# Patient Record
Sex: Female | Born: 1950 | Race: Black or African American | Hispanic: No | Smoking: Former smoker
Health system: Southern US, Community
[De-identification: ages and names within clinical notes are randomized; demographics above are authoritative.]

## PROBLEM LIST (undated history)

## (undated) DIAGNOSIS — M47816 Spondylosis without myelopathy or radiculopathy, lumbar region: Secondary | ICD-10-CM

## (undated) DIAGNOSIS — I10 Essential (primary) hypertension: Secondary | ICD-10-CM

## (undated) DIAGNOSIS — E785 Hyperlipidemia, unspecified: Secondary | ICD-10-CM

## (undated) DIAGNOSIS — I708 Atherosclerosis of other arteries: Secondary | ICD-10-CM

## (undated) DIAGNOSIS — E669 Obesity, unspecified: Secondary | ICD-10-CM

## (undated) DIAGNOSIS — M62838 Other muscle spasm: Secondary | ICD-10-CM

## (undated) DIAGNOSIS — M419 Scoliosis, unspecified: Secondary | ICD-10-CM

## (undated) DIAGNOSIS — I700 Atherosclerosis of aorta: Secondary | ICD-10-CM

## (undated) HISTORY — DX: Atherosclerosis of aorta: I70.0

## (undated) HISTORY — DX: Scoliosis, unspecified: M41.9

## (undated) HISTORY — DX: Other muscle spasm: M62.838

## (undated) HISTORY — DX: Essential (primary) hypertension: I10

## (undated) HISTORY — DX: Spondylosis without myelopathy or radiculopathy, lumbar region: M47.816

## (undated) HISTORY — DX: Obesity, unspecified: E66.9

## (undated) HISTORY — DX: Atherosclerosis of other arteries: I70.8

## (undated) HISTORY — DX: Hyperlipidemia, unspecified: E78.5

---

## 1998-06-17 HISTORY — PX: ABDOMINAL HYSTERECTOMY: SHX81

## 2013-06-07 ENCOUNTER — Ambulatory Visit: Payer: Self-pay | Admitting: Gastroenterology

## 2013-06-14 ENCOUNTER — Ambulatory Visit: Payer: Self-pay | Admitting: Family Medicine

## 2013-06-29 ENCOUNTER — Ambulatory Visit: Payer: Self-pay | Admitting: Family Medicine

## 2014-12-05 ENCOUNTER — Other Ambulatory Visit: Payer: Self-pay

## 2014-12-05 DIAGNOSIS — I10 Essential (primary) hypertension: Secondary | ICD-10-CM

## 2014-12-05 MED ORDER — LISINOPRIL 40 MG PO TABS: 40.0000 mg | ORAL_TABLET | Freq: Every day | ORAL | Status: DC

## 2014-12-05 MED ORDER — HYDROCHLOROTHIAZIDE 25 MG PO TABS: 25.0000 mg | ORAL_TABLET | Freq: Every day | ORAL | Status: DC

## 2014-12-05 NOTE — Telephone Encounter (Signed)
Received a request for refill for her HCTZ 25 mg & Lisinopril 40mg  to be sent to [facility]

## 2015-05-02 ENCOUNTER — Other Ambulatory Visit: Payer: Self-pay

## 2015-05-02 DIAGNOSIS — I10 Essential (primary) hypertension: Secondary | ICD-10-CM

## 2015-05-02 MED ORDER — LISINOPRIL 40 MG PO TABS: 40.0000 mg | ORAL_TABLET | Freq: Every day | ORAL | Status: DC

## 2015-05-02 MED ORDER — HYDROCHLOROTHIAZIDE 25 MG PO TABS: 25.0000 mg | ORAL_TABLET | Freq: Every day | ORAL | Status: DC

## 2015-05-02 NOTE — Telephone Encounter (Signed)
Got a fax from [place] requesting a refill of this patient's Prinivil 40mg  & HCTZ 25mg .  Refill request was sent to Dr. Bobetta Lime for approval and submission.  Lisinopril 40MG , 1 (one) Tablet daily, #30, 30 days starting 05/31/2014, Ref. x5. Active. Associated Diagnosis:Hypertension, goal below 150/90 (401.9  I10) Recorded 05/31/2014 04:39 PM by Bobetta Lime, MD, Office Visit.  Hydrochlorothiazide 25MG , 1 (one) Tablet daily, #30, 30 days starting 05/31/2014, Ref. x5. Active. Associated Diagnosis:Hypertension, goal below 150/90 (401.9  I10) Recorded 05/31/2014 04:39 PM by Bobetta Lime, MD, Office Visit.

## 2015-05-05 ENCOUNTER — Telehealth: Payer: Self-pay

## 2015-05-05 NOTE — Telephone Encounter (Signed)
Got a callfrom the pharmacy tech, Levada Dy, requesting a refill of this patient's HCTZ and Lisnopril. Dr. Nadine Counts spoke with her and authorized the refill with a 90 days supply.

## 2015-11-14 ENCOUNTER — Ambulatory Visit (INDEPENDENT_AMBULATORY_CARE_PROVIDER_SITE_OTHER): Payer: Managed Care, Other (non HMO) | Admitting: Family Medicine

## 2015-11-14 ENCOUNTER — Encounter: Payer: Self-pay | Admitting: Family Medicine

## 2015-11-14 VITALS — BP 122/82 | HR 106 | Temp 98.3°F | Resp 20 | Ht 63.0 in | Wt 186.3 lb

## 2015-11-14 DIAGNOSIS — M40294 Other kyphosis, thoracic region: Secondary | ICD-10-CM

## 2015-11-14 DIAGNOSIS — Z1239 Encounter for other screening for malignant neoplasm of breast: Secondary | ICD-10-CM | POA: Diagnosis not present

## 2015-11-14 DIAGNOSIS — Z23 Encounter for immunization: Secondary | ICD-10-CM | POA: Diagnosis not present

## 2015-11-14 DIAGNOSIS — Z1159 Encounter for screening for other viral diseases: Secondary | ICD-10-CM | POA: Diagnosis not present

## 2015-11-14 DIAGNOSIS — Z114 Encounter for screening for human immunodeficiency virus [HIV]: Secondary | ICD-10-CM | POA: Diagnosis not present

## 2015-11-14 DIAGNOSIS — Z0000 Encounter for general adult medical examination without abnormal findings: Secondary | ICD-10-CM

## 2015-11-14 MED ORDER — CYCLOBENZAPRINE HCL 5 MG PO TABS: 5.0000 mg | ORAL_TABLET | Freq: Three times a day (TID) | ORAL | Status: DC | PRN

## 2015-11-14 NOTE — Patient Instructions (Addendum)
Do get vitamin D3 1,000 iu daily Return for fasting labs later this week Start a baby 81 mg coated aspirin once a day for stroke prevention Please do call to schedule your bone density study; the number to schedule one at either [facility] or [facility] is [phone]  Health Maintenance, Female Adopting a healthy lifestyle and getting preventive care can go a long way to promote health and wellness. Talk with your health care provider about what schedule of regular examinations is right for you. This is a good chance for you to check in with your provider about disease prevention and staying healthy. In between checkups, there are plenty of things you can do on your own. Experts have done a lot of research about which lifestyle changes and preventive measures are most likely to keep you healthy. Ask your health care provider for more information. WEIGHT AND DIET  Eat a healthy diet  Be sure to include plenty of vegetables, fruits, low-fat dairy products, and lean protein.  Do not eat a lot of foods high in solid fats, added sugars, or salt.  Get regular exercise. This is one of the most important things you can do for your health.  Most adults should exercise for at least 150 minutes each week. The exercise should increase your heart rate and make you sweat (moderate-intensity exercise).  Most adults should also do strengthening exercises at least twice a week. This is in addition to the moderate-intensity exercise.  Maintain a healthy weight  Body mass index (BMI) is a measurement that can be used to identify possible weight problems. It estimates body fat based on height and weight. Your health care provider can help determine your BMI and help you achieve or maintain a healthy weight.  For females 76 years of age and older:   A BMI below 18.5 is considered underweight.  A BMI of 18.5 to 24.9 is normal.  A BMI of 25 to 29.9 is considered  overweight.  A BMI of 30 and above is considered obese.  Watch levels of cholesterol and blood lipids  You should start having your blood tested for lipids and cholesterol at 63 years of age, then have this test every 5 years.  You may need to have your cholesterol levels checked more often if:  Your lipid or cholesterol levels are high.  You are older than 63 years of age.  You are at high risk for heart disease.  CANCER SCREENING   Lung Cancer  Lung cancer screening is recommended for adults 3-70 years old who are at high risk for lung cancer because of a history of smoking.  A yearly low-dose CT scan of the lungs is recommended for people who:  Currently smoke.  Have quit within the past 15 years.  Have at least a 30-pack-year history of smoking. A pack year is smoking an average of one pack of cigarettes a day for 1 year.  Yearly screening should continue until it has been 15 years since you quit.  Yearly screening should stop if you develop a health problem that would prevent you from having lung cancer treatment.  Breast Cancer  Practice breast self-awareness. This means understanding how your breasts normally appear and feel.  It also means doing regular breast self-exams. Let your health care provider know about any changes, no matter how small.  If you are in your 20s or 30s, you should have a clinical breast exam (CBE) by a health care  provider every 1-3 years as part of a regular health exam.  If you are 34 or older, have a CBE every year. Also consider having a breast X-ray (mammogram) every year.  If you have a family history of breast cancer, talk to your health care provider about genetic screening.  If you are at high risk for breast cancer, talk to your health care provider about having an MRI and a mammogram every year.  Breast cancer gene (BRCA) assessment is recommended for women who have family members with BRCA-related cancers. BRCA-related  cancers include:  Breast.  Ovarian.  Tubal.  Peritoneal cancers.  Results of the assessment will determine the need for genetic counseling and BRCA1 and BRCA2 testing. Cervical Cancer Your health care provider may recommend that you be screened regularly for cancer of the pelvic organs (ovaries, uterus, and vagina). This screening involves a pelvic examination, including checking for microscopic changes to the surface of your cervix (Pap test). You may be encouraged to have this screening done every 3 years, beginning at age 27.  For women ages 60-65, health care providers may recommend pelvic exams and Pap testing every 3 years, or they may recommend the Pap and pelvic exam, combined with testing for human papilloma virus (HPV), every 5 years. Some types of HPV increase your risk of cervical cancer. Testing for HPV may also be done on women of any age with unclear Pap test results.  Other health care providers may not recommend any screening for nonpregnant women who are considered low risk for pelvic cancer and who do not have symptoms. Ask your health care provider if a screening pelvic exam is right for you.  If you have had past treatment for cervical cancer or a condition that could lead to cancer, you need Pap tests and screening for cancer for at least 20 years after your treatment. If Pap tests have been discontinued, your risk factors (such as having a new sexual partner) need to be reassessed to determine if screening should resume. Some women have medical problems that increase the chance of getting cervical cancer. In these cases, your health care provider may recommend more frequent screening and Pap tests. Colorectal Cancer  This type of cancer can be detected and often prevented.  Routine colorectal cancer screening usually begins at 63 years of age and continues through 63 years of age.  Your health care provider may recommend screening at an earlier age if you have risk  factors for colon cancer.  Your health care provider may also recommend using home test kits to check for hidden blood in the stool.  A small camera at the end of a tube can be used to examine your colon directly (sigmoidoscopy or colonoscopy). This is done to check for the earliest forms of colorectal cancer.  Routine screening usually begins at age 44.  Direct examination of the colon should be repeated every 5-10 years through 63 years of age. However, you may need to be screened more often if early forms of precancerous polyps or small growths are found. Skin Cancer  Check your skin from head to toe regularly.  Tell your health care provider about any new moles or changes in moles, especially if there is a change in a mole's shape or color.  Also tell your health care provider if you have a mole that is larger than the size of a pencil eraser.  Always use sunscreen. Apply sunscreen liberally and repeatedly throughout the day.  Protect yourself  by wearing long sleeves, pants, a wide-brimmed hat, and sunglasses whenever you are outside. HEART DISEASE, DIABETES, AND HIGH BLOOD PRESSURE   High blood pressure causes heart disease and increases the risk of stroke. High blood pressure is more likely to develop in:  People who have blood pressure in the high end of the normal range (130-139/85-89 mm Hg).  People who are overweight or obese.  People who are African American.  If you are 51-52 years of age, have your blood pressure checked every 3-5 years. If you are 73 years of age or older, have your blood pressure checked every year. You should have your blood pressure measured twice--once when you are at a hospital or clinic, and once when you are not at a hospital or clinic. Record the average of the two measurements. To check your blood pressure when you are not at a hospital or clinic, you can use:  An automated blood pressure machine at a pharmacy.  A home blood pressure  monitor.  If you are between 80 years and 10 years old, ask your health care provider if you should take aspirin to prevent strokes.  Have regular diabetes screenings. This involves taking a blood sample to check your fasting blood sugar level.  If you are at a normal weight and have a low risk for diabetes, have this test once every three years after 63 years of age.  If you are overweight and have a high risk for diabetes, consider being tested at a younger age or more often. PREVENTING INFECTION  Hepatitis B  If you have a higher risk for hepatitis B, you should be screened for this virus. You are considered at high risk for hepatitis B if:  You were born in a country where hepatitis B is common. Ask your health care provider which countries are considered high risk.  Your parents were born in a high-risk country, and you have not been immunized against hepatitis B (hepatitis B vaccine).  You have HIV or AIDS.  You use needles to inject street drugs.  You live with someone who has hepatitis B.  You have had sex with someone who has hepatitis B.  You get hemodialysis treatment.  You take certain medicines for conditions, including cancer, organ transplantation, and autoimmune conditions. Hepatitis C  Blood testing is recommended for:  Everyone born from [birth year] through 1963.  Anyone with known risk factors for hepatitis C. Sexually transmitted infections (STIs)  You should be screened for sexually transmitted infections (STIs) including gonorrhea and chlamydia if:  You are sexually active and are younger than 63 years of age.  You are older than 63 years of age and your health care provider tells you that you are at risk for this type of infection.  Your sexual activity has changed since you were last screened and you are at an increased risk for chlamydia or gonorrhea. Ask your health care provider if you are at risk.  If you do not have HIV, but are at risk, it may be  recommended that you take a prescription medicine daily to prevent HIV infection. This is called pre-exposure prophylaxis (PrEP). You are considered at risk if:  You are sexually active and do not regularly use condoms or know the HIV status of your partner(s).  You take drugs by injection.  You are sexually active with a partner who has HIV. Talk with your health care provider about whether you are at high risk of being infected with HIV.  If you choose to begin PrEP, you should first be tested for HIV. You should then be tested every 3 months for as long as you are taking PrEP.  PREGNANCY   If you are premenopausal and you may become pregnant, ask your health care provider about preconception counseling.  If you may become pregnant, take 400 to 800 micrograms (mcg) of folic acid every day.  If you want to prevent pregnancy, talk to your health care provider about birth control (contraception). OSTEOPOROSIS AND MENOPAUSE   Osteoporosis is a disease in which the bones lose minerals and strength with aging. This can result in serious bone fractures. Your risk for osteoporosis can be identified using a bone density scan.  If you are 27 years of age or older, or if you are at risk for osteoporosis and fractures, ask your health care provider if you should be screened.  Ask your health care provider whether you should take a calcium or vitamin D supplement to lower your risk for osteoporosis.  Menopause may have certain physical symptoms and risks.  Hormone replacement therapy may reduce some of these symptoms and risks. Talk to your health care provider about whether hormone replacement therapy is right for you.  HOME CARE INSTRUCTIONS   Schedule regular health, dental, and eye exams.  Stay current with your immunizations.   Do not use any tobacco products including cigarettes, chewing tobacco, or electronic cigarettes.  If you are pregnant, do not drink alcohol.  If you are  breastfeeding, limit how much and how often you drink alcohol.  Limit alcohol intake to no more than 1 drink per day for nonpregnant women. One drink equals 12 ounces of beer, 5 ounces of wine, or 1 ounces of hard liquor.  Do not use street drugs.  Do not share needles.  Ask your health care provider for help if you need support or information about quitting drugs.  Tell your health care provider if you often feel depressed.  Tell your health care provider if you have ever been abused or do not feel safe at home.   This information is not intended to replace advice given to you by your health care provider. Make sure you discuss any questions you have with your health care provider.   Document Released: 12/17/2010 Document Revised: 06/24/2014 Document Reviewed: 05/05/2013 Elsevier Interactive Patient Education [employer].

## 2015-11-14 NOTE — Progress Notes (Signed)
Patient ID: Judith Olson, female   DOB: 12/23/1950, 65 y.o.   MRN: [id number]   Subjective:   Judith Olson is a 65 y.o. female here for a complete physical exam  Interim issues since last visit: muscle spasms on left side of thoracic area; went to ER, was given meds  USPSTF grade A and B recommendations Alcohol: twice a year Depression:  Depression screen [facility] 2/9 11/14/2015  Decreased Interest 0  Down, Depressed, Hopeless 0  PHQ - 2 Score 0   Hypertension: controlled Obesity: working on weight loss Tobacco use: former smoker; quit in 1991 HIV, hep C: okay STD testing and prevention (chl/gon/syphilis): urine for GC/CHL Lipids: return on Friday Glucose: return on Friday Colorectal cancer: 2 years ago at [facility] Breast cancer: done 2 years Intimate partner violence:no Cervical cancer screening: s/p hyst Lung cancer: quit in 1991 Osteoporosis: usually start at age 44, but she has kyphosis Fall prevention/vitamin D: no regular supplements AAA: n/a Aspirin: baby aspirin 81 mg daily Diet: good diet Exercise: getting exercise Skin cancer: nothing worrisome, dark spots on the face; mole on left breast stable  Past Medical History  Diagnosis Date  . Hypertension   . Muscle spasms of both lower extremities     Obesity; 225 pounds peak weight  Past Surgical History  Procedure Laterality Date  . Abdominal hysterectomy  2000    MD notes: Hysterectomy; for fibroids; no cancer, BSO 2000  Family History  Problem Relation Age of Onset  . Cancer Father     porstate  . Cancer Sister     breast    Mother: deceased, diabetes; died from aneurysm and broken heart after her husband died; 38 or 79 years old Father: deceased, prostate cancer about 20 or 31 years old Sister: died of breast cancer, stopped treatment, died at age 44 Brother: heart attack, from stress after mother died  Social History  Substance Use Topics  . Smoking status: Former Research scientist (life sciences)  . Smokeless  tobacco: Not on file  . Alcohol Use: 0.0 oz/week    0 Standard drinks or equivalent per week   Review of Systems  Constitutional: Negative for unexpected weight change.  HENT: Negative for hearing loss.   Eyes: Negative for visual disturbance.  Respiratory: Negative for shortness of breath.   Cardiovascular: Negative for chest pain and leg swelling.  Gastrointestinal: Negative for blood in stool.  Genitourinary: Negative for hematuria.  Musculoskeletal: Positive for back pain.  Hematological: Does not bruise/bleed easily.   Objective:   Filed Vitals:   11/14/15 1423  BP: 122/82  Pulse: 106  Temp: 98.3 F (36.8 C)  Resp: 20  Height: 5\' 3"  (1.6 m)  Weight: 186 lb 5 oz (84.511 kg)  SpO2: 98%   Body mass index is 33.01 kg/(m^2). Wt Readings from Last 3 Encounters:  11/14/15 186 lb 5 oz (84.511 kg)   Physical Exam  Constitutional: She appears well-developed and well-nourished.  HENT:  Head: Normocephalic and atraumatic.  Right Ear: Hearing, tympanic membrane, external ear and ear canal normal.  Left Ear: Hearing, tympanic membrane, external ear and ear canal normal.  Eyes: Conjunctivae and EOM are normal. Right eye exhibits no hordeolum. Left eye exhibits no hordeolum. No scleral icterus.  Neck: Carotid bruit is not present. No thyromegaly present.  Cardiovascular: Normal rate, regular rhythm, S1 normal, S2 normal and normal heart sounds.   No extrasystoles are present.  Pulmonary/Chest: Effort normal and breath sounds normal. No respiratory distress. Right breast exhibits no inverted nipple,  no mass, no nipple discharge, no skin change and no tenderness. Left breast exhibits no inverted nipple, no mass, no nipple discharge, no skin change and no tenderness. Breasts are symmetrical.  Abdominal: Soft. Normal appearance and bowel sounds are normal. She exhibits no distension, no abdominal bruit, no pulsatile midline mass and no mass. There is no hepatosplenomegaly. There is no  tenderness. No hernia.  Musculoskeletal: Normal range of motion. She exhibits no edema.       Thoracic back: She exhibits deformity (thoracic kyphosis).  Lymphadenopathy:       Head (right side): No submandibular adenopathy present.       Head (left side): No submandibular adenopathy present.    She has no cervical adenopathy.    She has no axillary adenopathy.  Neurological: She is alert. She displays no tremor. No cranial nerve deficit. She exhibits normal muscle tone. Gait normal.  Reflex Scores:      Patellar reflexes are 2+ on the right side and 2+ on the left side. Skin: Skin is warm and dry. No bruising and no ecchymosis noted. No cyanosis. No pallor.  Psychiatric: Her speech is normal and behavior is normal. Thought content normal. Her mood appears not anxious. She does not exhibit a depressed mood.    Assessment/Plan:   Problem List Items Addressed This Visit      Musculoskeletal and Integument   Other kyphosis of thoracic region    DEXA scan      Relevant Orders   DG Bone Density     Other   Breast cancer screening    SBE encouraged once a month; CBE today; mammo ordered      Relevant Orders   MM DIGITAL SCREENING BILATERAL   Need for hepatitis C screening test    Discussed one-time HIV screening recommendation per USPSTF guidelines; patient agrees with testing; HIV antibody ordered      Relevant Orders   Hepatitis C antibody (Completed)   Need for shingles vaccine   Relevant Orders   Varicella-zoster vaccine subcutaneous (Completed)   Preventative health care - Primary    USPSTF grade A and B recommendations reviewed with patient; age-appropriate recommendations, preventive care, screening tests, etc discussed and encouraged; healthy living encouraged; see AVS for patient education given to patient      Relevant Orders   CBC with Differential/Platelet (Completed)   Comprehensive metabolic panel (Completed)   Lipid Panel w/o Chol/HDL Ratio (Completed)    TSH (Completed)   Screening for HIV (human immunodeficiency virus)    Discussed one-time HIV screening recommendation per USPSTF guidelines; patient agrees with testing; HIV antibody ordered      Relevant Orders   HIV antibody (Completed)      Meds ordered this encounter  Medications  . cyclobenzaprine (FLEXERIL) 5 MG tablet    Sig: Take 1 tablet (5 mg total) by mouth every 8 (eight) hours as needed for muscle spasms.    Dispense:  21 tablet    Refill:  1   Orders Placed This Encounter  Procedures  . DG Bone Density    Order Specific Question:  Reason for Exam (SYMPTOM  OR DIAGNOSIS REQUIRED)    Answer:  screening    Order Specific Question:  Preferred imaging location?    Answer:  [facility]  . MM DIGITAL SCREENING BILATERAL    Order Specific Question:  Reason for Exam (SYMPTOM  OR DIAGNOSIS REQUIRED)    Answer:  screening    Order Specific Question:  Preferred imaging location?  Answer:  [facility]  . Varicella-zoster vaccine subcutaneous  . CBC with Differential/Platelet  . Comprehensive metabolic panel    Order Specific Question:  Has the patient fasted?    Answer:  Yes  . Lipid Panel w/o Chol/HDL Ratio    Order Specific Question:  Has the patient fasted?    Answer:  Yes  . HIV antibody  . Hepatitis C antibody  . TSH   Follow up plan: Return in about 1 year (around 11/13/2016) for complete physical.  An After Visit Summary was printed and given to the patient.

## 2015-11-17 ENCOUNTER — Other Ambulatory Visit: Payer: Self-pay

## 2015-11-21 LAB — COMPREHENSIVE METABOLIC PANEL
A/G RATIO: 1.5 (ref 1.2–2.2)
ALBUMIN: 4.3 g/dL (ref 3.6–4.8)
ALT: 10 IU/L (ref 0–32)
AST: 14 IU/L (ref 0–40)
Alkaline Phosphatase: 90 IU/L (ref 39–117)
BILIRUBIN TOTAL: 0.5 mg/dL (ref 0.0–1.2)
BUN / CREAT RATIO: 13 (ref 12–28)
BUN: 12 mg/dL (ref 8–27)
CHLORIDE: 99 mmol/L (ref 96–106)
CO2: 25 mmol/L (ref 18–29)
Calcium: 10.2 mg/dL (ref 8.7–10.3)
Creatinine, Ser: 0.92 mg/dL (ref 0.57–1.00)
GFR, EST AFRICAN AMERICAN: 77 mL/min/{1.73_m2} (ref >59)
GFR, EST NON AFRICAN AMERICAN: 67 mL/min/{1.73_m2} (ref >59)
Globulin, Total: 2.8 g/dL (ref 1.5–4.5)
Glucose: 95 mg/dL (ref 65–99)
POTASSIUM: 3.9 mmol/L (ref 3.5–5.2)
Sodium: 140 mmol/L (ref 134–144)
TOTAL PROTEIN: 7.1 g/dL (ref 6.0–8.5)

## 2015-11-21 LAB — CBC WITH DIFFERENTIAL/PLATELET
BASOS: 0 %
Basophils Absolute: 0 10*3/uL (ref 0.0–0.2)
EOS (ABSOLUTE): 0.1 10*3/uL (ref 0.0–0.4)
EOS: 1 %
HEMATOCRIT: 38.1 % (ref 34.0–46.6)
HEMOGLOBIN: 12.4 g/dL (ref 11.1–15.9)
IMMATURE GRANS (ABS): 0 10*3/uL (ref 0.0–0.1)
Immature Granulocytes: 0 %
LYMPHS: 47 %
Lymphocytes Absolute: 2.4 10*3/uL (ref 0.7–3.1)
MCH: 27.2 pg (ref 26.6–33.0)
MCHC: 32.5 g/dL (ref 31.5–35.7)
MCV: 84 fL (ref 79–97)
MONOCYTES: 8 %
Monocytes Absolute: 0.4 10*3/uL (ref 0.1–0.9)
NEUTROS ABS: 2.3 10*3/uL (ref 1.4–7.0)
Neutrophils: 44 %
Platelets: 222 10*3/uL (ref 150–379)
RBC: 4.56 x10E6/uL (ref 3.77–5.28)
RDW: 15.3 % (ref 12.3–15.4)
WBC: 5.1 10*3/uL (ref 3.4–10.8)

## 2015-11-21 LAB — HEPATITIS C ANTIBODY

## 2015-11-21 LAB — LIPID PANEL W/O CHOL/HDL RATIO
Cholesterol, Total: 175 mg/dL (ref 100–199)
HDL: 48 mg/dL (ref >39)
LDL Calculated: 109 mg/dL — ABNORMAL HIGH (ref 0–99)
Triglycerides: 89 mg/dL (ref 0–149)
VLDL CHOLESTEROL CAL: 18 mg/dL (ref 5–40)

## 2015-11-21 LAB — TSH: TSH: 0.767 u[IU]/mL (ref 0.450–4.500)

## 2015-11-21 LAB — HIV ANTIBODY (ROUTINE TESTING W REFLEX): HIV SCREEN 4TH GENERATION: NONREACTIVE

## 2015-12-10 ENCOUNTER — Encounter: Payer: Self-pay | Admitting: Family Medicine

## 2015-12-10 DIAGNOSIS — Z1239 Encounter for other screening for malignant neoplasm of breast: Secondary | ICD-10-CM | POA: Insufficient documentation

## 2015-12-10 DIAGNOSIS — M40294 Other kyphosis, thoracic region: Secondary | ICD-10-CM | POA: Insufficient documentation

## 2015-12-10 DIAGNOSIS — Z1159 Encounter for screening for other viral diseases: Secondary | ICD-10-CM | POA: Insufficient documentation

## 2015-12-10 DIAGNOSIS — Z0000 Encounter for general adult medical examination without abnormal findings: Secondary | ICD-10-CM | POA: Insufficient documentation

## 2015-12-10 DIAGNOSIS — Z23 Encounter for immunization: Secondary | ICD-10-CM | POA: Insufficient documentation

## 2015-12-10 DIAGNOSIS — Z114 Encounter for screening for human immunodeficiency virus [HIV]: Secondary | ICD-10-CM | POA: Insufficient documentation

## 2015-12-10 NOTE — Assessment & Plan Note (Signed)
USPSTF grade A and B recommendations reviewed with patient; age-appropriate recommendations, preventive care, screening tests, etc discussed and encouraged; healthy living encouraged; see AVS for patient education given to patient  

## 2015-12-10 NOTE — Assessment & Plan Note (Signed)
Discussed one-time HIV screening recommendation per USPSTF guidelines; patient agrees with testing; HIV antibody ordered 

## 2015-12-10 NOTE — Assessment & Plan Note (Signed)
SBE encouraged once a month; CBE today; mammo ordered

## 2015-12-10 NOTE — Assessment & Plan Note (Signed)
DEXA scan

## 2016-05-15 ENCOUNTER — Emergency Department: Payer: Managed Care, Other (non HMO)

## 2016-05-15 ENCOUNTER — Encounter: Payer: Self-pay | Admitting: *Deleted

## 2016-05-15 ENCOUNTER — Emergency Department
Admission: EM | Admit: 2016-05-15 | Discharge: 2016-05-15 | Disposition: A | Discharge status: Home / Self Care | Payer: Managed Care, Other (non HMO) | Attending: Emergency Medicine | Admitting: Emergency Medicine

## 2016-05-15 DIAGNOSIS — I10 Essential (primary) hypertension: Secondary | ICD-10-CM | POA: Diagnosis not present

## 2016-05-15 DIAGNOSIS — M1991 Primary osteoarthritis, unspecified site: Secondary | ICD-10-CM | POA: Diagnosis not present

## 2016-05-15 DIAGNOSIS — M419 Scoliosis, unspecified: Secondary | ICD-10-CM | POA: Diagnosis not present

## 2016-05-15 DIAGNOSIS — Z87891 Personal history of nicotine dependence: Secondary | ICD-10-CM | POA: Insufficient documentation

## 2016-05-15 DIAGNOSIS — M545 Low back pain: Secondary | ICD-10-CM | POA: Diagnosis present

## 2016-05-15 MED ORDER — MELOXICAM 15 MG PO TABS: 15.0000 mg | ORAL_TABLET | Freq: Every day | ORAL | 0 refills | Status: DC

## 2016-05-15 NOTE — ED Triage Notes (Signed)
States mid back pain for several months, denies any known injury, pt ambulatory

## 2016-05-15 NOTE — ED Provider Notes (Signed)
[facility] Emergency Department Provider Note ____________________________________________  Time seen: Approximately 3:24 PM  I have reviewed the triage vital signs and the nursing notes.   HISTORY  Chief Complaint Back Pain    HPI Judith Olson is a 65 y.o. female that presents with 6 months of low right back pain. Patient describes pain as squeezing. Pain does not radiate. Pain does not wake patient at night.  No urinary incontinence. No difficulty walking. Patient denies tingling, weakness, or numbness. Patient has not seen anyone for this before. Patient has been taking Advil for pain which does not help.   Past Medical History:  Diagnosis Date  . Hypertension   . Muscle spasms of both lower extremities     Patient Active Problem List   Diagnosis Date Noted  . Other kyphosis of thoracic region 12/10/2015  . Preventative health care 12/10/2015  . Breast cancer screening 12/10/2015  . Need for shingles vaccine 12/10/2015  . Screening for HIV (human immunodeficiency virus) 12/10/2015  . Need for hepatitis C screening test 12/10/2015    Past Surgical History:  Procedure Laterality Date  . ABDOMINAL HYSTERECTOMY  2000    Prior to Admission medications   Medication Sig Start Date End Date Taking? Authorizing Provider  cyclobenzaprine (FLEXERIL) 5 MG tablet Take 1 tablet (5 mg total) by mouth every 8 (eight) hours as needed for muscle spasms. 11/14/15   Arnetha Courser, MD  hydrochlorothiazide (HYDRODIURIL) 25 MG tablet Take 1 tablet (25 mg total) by mouth daily. 05/02/15   Bobetta Lime, MD  lisinopril (PRINIVIL,ZESTRIL) 40 MG tablet Take 1 tablet (40 mg total) by mouth daily. 05/02/15   Bobetta Lime, MD  meloxicam (MOBIC) 15 MG tablet Take 1 tablet (15 mg total) by mouth daily. 05/15/16 05/15/17  Laban Emperor, PA-C    Allergies Patient has no known allergies.  Family History  Problem Relation Age of Onset  . Cancer Father     porstate   . Cancer Sister     breast    Social History Social History  Substance Use Topics  . Smoking status: Former Research scientist (life sciences)  . Smokeless tobacco: Not on file  . Alcohol use 0.0 oz/week    Review of Systems Constitutional: No recent illness. Cardiovascular: Denies chest pain or palpitations. Respiratory: Denies shortness of breath. Musculoskeletal: Pain in low right back. Skin: Negative for rash, wound, lesion. Neurological: Negative for focal weakness or numbness.  ____________________________________________   PHYSICAL EXAM:  VITAL SIGNS: ED Triage Vitals [05/15/16 1437]  Enc Vitals Group     BP (!) 162/96     Pulse Rate 63     Resp 18     Temp 97.9 F (36.6 C)     Temp Source Oral     SpO2 100 %     Weight 186 lb (84.4 kg)     Height 5\' 2"  (1.575 m)     Head Circumference      Peak Flow      Pain Score 6     Pain Loc      Pain Edu?      Excl. in [place]?     Constitutional: Alert and oriented. Well appearing and in no acute distress. Eyes: Conjunctivae are normal. EOMI. Head: Atraumatic. Neck: No stridor.  Cardiovascular: Regular rate and rhythm. Respiratory: Normal respiratory effort.   Abdomen: Normoactive bowel sounds. No tenderness to palpation. No abdominal bruits.  Musculoskeletal: Tender to palpation over right SI joint. No tenderness over vertebrae. Full ROM  of back. Full ROM of upper and lower extremities. Negative faber test, straight leg raise, cross leg raise. No CVA tenderness.  Neurologic:  Normal speech and language. No gross focal neurologic deficits are appreciated. Speech is normal. No gait instability. Strength 5/5 in lower extremities. No sensation changes. Skin:  Skin is warm, dry and intact. Atraumatic. Psychiatric: Mood and affect are normal. Speech and behavior are normal. __________________________________________   LABS (all labs ordered are listed, but only abnormal results are displayed)  Labs Reviewed - No data to  display ____________________________________________  RADIOLOGY I, Laban Emperor, personally viewed and evaluated these images (plain radiographs) as part of my medical decision making, as well as reviewing the written report by the radiologist.  Xray findings per radiology.  1. Lumbar spine scoliosis concave left with diffuse degenerative  change. No acute bony abnormality.    2. 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  Patient presents with back pain for 6 months. Xray was ordered since pain has been present for 6 months. Xray showed scoliosis, which patient was aware of, and degenerative changes. Patient was referred to orthopedics. Patient was advised to make an appointment if back pain persists despite anti-inflammatory use. No evidence of cauda hematoma, vascular catastrophe, spinal abscess/hematoma, or referred intra-abdominal pain. All patients questions questions were answered. All Xray findings were disclosed to patient. Patient was advised to follow up with PCP for atherosclerotic vascular disease seen on xray.  ____________________________________________   FINAL CLINICAL IMPRESSION(S) / ED DIAGNOSES  Final diagnoses:  Primary osteoarthritis, unspecified site  Scoliosis of lumbar spine, unspecified scoliosis type      Laban Emperor, PA-C 05/15/16 1627    Carrie Mew, MD 05/15/16 1939

## 2016-06-06 ENCOUNTER — Encounter: Payer: Self-pay | Admitting: Family Medicine

## 2016-06-06 ENCOUNTER — Ambulatory Visit (INDEPENDENT_AMBULATORY_CARE_PROVIDER_SITE_OTHER): Payer: Managed Care, Other (non HMO) | Admitting: Family Medicine

## 2016-06-06 VITALS — BP 130/80 | HR 67 | Temp 98.4°F | Resp 14 | Wt 169.0 lb

## 2016-06-06 DIAGNOSIS — E669 Obesity, unspecified: Secondary | ICD-10-CM

## 2016-06-06 DIAGNOSIS — I708 Atherosclerosis of other arteries: Secondary | ICD-10-CM

## 2016-06-06 DIAGNOSIS — I700 Atherosclerosis of aorta: Secondary | ICD-10-CM | POA: Diagnosis not present

## 2016-06-06 DIAGNOSIS — M419 Scoliosis, unspecified: Secondary | ICD-10-CM

## 2016-06-06 DIAGNOSIS — Z5181 Encounter for therapeutic drug level monitoring: Secondary | ICD-10-CM | POA: Diagnosis not present

## 2016-06-06 DIAGNOSIS — M47816 Spondylosis without myelopathy or radiculopathy, lumbar region: Secondary | ICD-10-CM

## 2016-06-06 DIAGNOSIS — E6609 Other obesity due to excess calories: Secondary | ICD-10-CM

## 2016-06-06 DIAGNOSIS — Z6830 Body mass index (BMI) 30.0-30.9, adult: Secondary | ICD-10-CM

## 2016-06-06 DIAGNOSIS — Z6837 Body mass index (BMI) 37.0-37.9, adult: Secondary | ICD-10-CM | POA: Insufficient documentation

## 2016-06-06 DIAGNOSIS — I70299 Other atherosclerosis of native arteries of extremities, unspecified extremity: Secondary | ICD-10-CM | POA: Diagnosis not present

## 2016-06-06 HISTORY — DX: Spondylosis without myelopathy or radiculopathy, lumbar region: M47.816

## 2016-06-06 HISTORY — DX: Atherosclerosis of other arteries: I70.8

## 2016-06-06 HISTORY — DX: Atherosclerosis of aorta: I70.0

## 2016-06-06 HISTORY — DX: Scoliosis, unspecified: M41.9

## 2016-06-06 HISTORY — DX: Obesity, unspecified: E66.9

## 2016-06-06 MED ORDER — ATORVASTATIN CALCIUM 20 MG PO TABS: 20.0000 mg | ORAL_TABLET | Freq: Every day | ORAL | 1 refills | Status: DC

## 2016-06-06 MED ORDER — MELOXICAM 15 MG PO TABS: 15.0000 mg | ORAL_TABLET | Freq: Every day | ORAL | 5 refills | Status: DC

## 2016-06-06 NOTE — Assessment & Plan Note (Signed)
Refer to physical therapy 

## 2016-06-06 NOTE — Assessment & Plan Note (Signed)
Patient is working hard on weight loss; if pannus becomes infected, develops candidiasis, take pictures, notify me; encouragement and praise given for her weight loss efforts

## 2016-06-06 NOTE — Patient Instructions (Signed)
Use the meloxicam as directed if needed Avoid other NSAIDs like Advil and Motrin Okay to use tylenol per package directions if needed Try to limit saturated fats in your diet (bologna, hot dogs, barbeque, cheeseburgers, hamburgers, steak, bacon, sausage, cheese, etc.) and get more fresh fruits, vegetables, and whole grains Start the cholesterol medicine Return in 6 weeks for fasting labs only

## 2016-06-06 NOTE — Assessment & Plan Note (Signed)
Check sgpt in 6 weeks; monitor creatninine on NSAID

## 2016-06-06 NOTE — Assessment & Plan Note (Signed)
rx for meloxicam approved, since this helps; avoid other NSAIDs; okay to take tylenol per package directions if something extra needed; refer to physical therapy

## 2016-06-06 NOTE — Assessment & Plan Note (Addendum)
Discussed findings with her of the atherosclerosis in her arteries; new LDL goal of less than 70; limit saturated fats; recommended statin; continue aspirin

## 2016-06-06 NOTE — Progress Notes (Signed)
BP 130/80   Pulse 67   Temp 98.4 F (36.9 C) (Oral)   Resp 14   Wt 169 lb (76.7 kg)   SpO2 97%   BMI 30.91 kg/m    Subjective:    Patient ID: Judith Olson, female    DOB: [birth year], 64 y.o.   MRN: [id number]  HPI: Judith Olson is a 65 y.o. female  Chief Complaint  Patient presents with  . Follow-up ER  . Back Pain    She saw the ER doctor for back pain on November 29th; note reviewed with her; she agrees with history; chronic low back pain, no urinary incontinence, no leg weakness; she was told she has arthritis; they prescribed meloxicam; she says the meloxicam really helps; taking about 3 days a week; she eats before she takes it and does fine She would like a refill  BP was up at the ER; patient says she was in pain that day ------------------------------------ Lumbar spine films May 15, 2016 EXAM: LUMBAR SPINE - 2-3 VIEW  COMPARISON:  No recent prior.  FINDINGS: Scoliosis lumbar spine. Diffuse multilevel degenerative change. No acute abnormality identified. Sclerotic density noted over the right iliac wing, most likely bone island. Sclerotic densities noted over the abdomen most likely vascular. Aortoiliac atherosclerotic vascular disease noted.  IMPRESSION: 1. Lumbar spine scoliosis concave left with diffuse degenerative change. No acute bony abnormality.  2. 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   Electronically Signed   By: Marcello Moores  Register   On: 05/15/2016 15:45 ------------------------------------------- We discussed the finding of aortoiliac and visceral atherosclerotic vascular disease Hx of smoking; quit a while ago; never a heavy smoker Last LDL 109; she has never taken a statin; she takes an aspirin daily  She has been working hard on weight loss; she wants to get flap of skin on abdomen removed later; watching what she eats; trying to lose weight; goal is 145 pounds; eating [place] and fish and chicken,  avoiding hamburgers; very occasional hot dog; boiling and baking food; she gets sweating under the skin of the fold of the abdomen; no redness now but she will watch; weight in the ER was NOT measured she says, she has lost the weight gradually over the last several months and did not lose all this weight over the last 3 weeks  Depression screen [facility] 2/9 06/06/2016 11/14/2015  Decreased Interest 0 0  Down, Depressed, Hopeless 0 0  PHQ - 2 Score 0 0   Relevant past medical, surgical, family and social history reviewed Past Medical History:  Diagnosis Date  . Hypertension   . Muscle spasms of both lower extremities   . Scoliosis of lumbar spine 06/06/2016   Past Surgical History:  Procedure Laterality Date  . ABDOMINAL HYSTERECTOMY  2000   Family History  Problem Relation Age of Onset  . Cancer Father     porstate  . Cancer Sister     breast   Social History  Substance Use Topics  . Smoking status: Former Research scientist (life sciences)  . Smokeless tobacco: Not on file  . Alcohol use 0.0 oz/week   Interim medical history since last visit reviewed. Allergies and medications reviewed  Review of Systems Per HPI unless specifically indicated above     Objective:    BP 130/80   Pulse 67   Temp 98.4 F (36.9 C) (Oral)   Resp 14   Wt 169 lb (76.7 kg)   SpO2 97%   BMI 30.91 kg/m   Wt  Readings from Last 3 Encounters:  06/06/16 169 lb (76.7 kg)  05/15/16 186 lb (84.4 kg)  11/14/15 186 lb 5 oz (84.5 kg)   MD note: patient was not weighed in the ER  Physical Exam  Constitutional: She appears well-developed and well-nourished. No distress.  Weight loss noted  Cardiovascular: Normal rate.   Pulmonary/Chest: Effort normal.  Musculoskeletal:       Lumbar back: She exhibits normal range of motion.  Normal ROM with flexion and extension at the waist  Neurological: She is alert. She displays no tremor. Gait normal.  LE strength 5/5  Skin: No pallor.  Psychiatric: She has a normal mood and affect.  Her behavior is normal.   Results for orders placed or performed in visit on 11/14/15  CBC with Differential/Platelet  Result Value Ref Range   WBC 5.1 3.4 - 10.8 x10E3/uL   RBC 4.56 3.77 - 5.28 x10E6/uL   Hemoglobin 12.4 11.1 - 15.9 g/dL   Hematocrit 38.1 34.0 - 46.6 %   MCV 84 79 - 97 fL   MCH 27.2 26.6 - 33.0 pg   MCHC 32.5 31.5 - 35.7 g/dL   RDW 15.3 12.3 - 15.4 %   Platelets 222 150 - 379 x10E3/uL   Neutrophils 44 %   Lymphs 47 %   Monocytes 8 %   Eos 1 %   Basos 0 %   Neutrophils Absolute 2.3 1.4 - 7.0 x10E3/uL   Lymphocytes Absolute 2.4 0.7 - 3.1 x10E3/uL   Monocytes Absolute 0.4 0.1 - 0.9 x10E3/uL   EOS (ABSOLUTE) 0.1 0.0 - 0.4 x10E3/uL   Basophils Absolute 0.0 0.0 - 0.2 x10E3/uL   Immature Granulocytes 0 %   Immature Grans (Abs) 0.0 0.0 - 0.1 x10E3/uL  Comprehensive metabolic panel  Result Value Ref Range   Glucose 95 65 - 99 mg/dL   BUN 12 8 - 27 mg/dL   Creatinine, Ser 0.92 0.57 - 1.00 mg/dL   GFR calc non Af Amer 67 >59 mL/min/1.73   GFR calc Af Amer 77 >59 mL/min/1.73   BUN/Creatinine Ratio 13 12 - 28   Sodium 140 134 - 144 mmol/L   Potassium 3.9 3.5 - 5.2 mmol/L   Chloride 99 96 - 106 mmol/L   CO2 25 18 - 29 mmol/L   Calcium 10.2 8.7 - 10.3 mg/dL   Total Protein 7.1 6.0 - 8.5 g/dL   Albumin 4.3 3.6 - 4.8 g/dL   Globulin, Total 2.8 1.5 - 4.5 g/dL   Albumin/Globulin Ratio 1.5 1.2 - 2.2   Bilirubin Total 0.5 0.0 - 1.2 mg/dL   Alkaline Phosphatase 90 39 - 117 IU/L   AST 14 0 - 40 IU/L   ALT 10 0 - 32 IU/L  Lipid Panel w/o Chol/HDL Ratio  Result Value Ref Range   Cholesterol, Total 175 100 - 199 mg/dL   Triglycerides 89 0 - 149 mg/dL   HDL 48 >39 mg/dL   VLDL Cholesterol Cal 18 5 - 40 mg/dL   LDL Calculated 109 (H) 0 - 99 mg/dL  HIV antibody  Result Value Ref Range   HIV Screen 4th Generation wRfx Non Reactive Non Reactive  Hepatitis C antibody  Result Value Ref Range   Hep C Virus Ab <0.1 0.0 - 0.9 s/co ratio  TSH  Result Value Ref Range   TSH  0.767 0.450 - 4.500 uIU/mL      Assessment & Plan:   Problem List Items Addressed This Visit      Cardiovascular  and Mediastinum   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)    Discussed findings with her of the atherosclerosis in her arteries; new LDL goal of less than 70; limit saturated fats; recommended statin; continue aspirin      Relevant Medications   atorvastatin (LIPITOR) 20 MG tablet   Other Relevant Orders   Lipid panel     Musculoskeletal and Integument   Scoliosis of lumbar spine (Chronic)    Refer to physical therapy      Relevant Orders   Ambulatory referral to Physical Therapy   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)    rx for meloxicam approved, since this helps; avoid other NSAIDs; okay to take tylenol per package directions if something extra needed; refer to physical therapy      Relevant Medications   meloxicam (MOBIC) 15 MG tablet   Other Relevant Orders   Ambulatory referral to Physical Therapy     Other   Obesity    Patient is working hard on weight loss; if pannus becomes infected, develops candidiasis, take pictures, notify me; encouragement and praise given for her weight loss efforts      Medication monitoring encounter    Check sgpt in 6 weeks; monitor creatninine on NSAID      Relevant Orders   COMPLETE METABOLIC PANEL WITH GFR       Follow up plan: No Follow-up on file.  An after-visit summary was printed and given to the patient at [place].  Please see the patient instructions which may contain other information and recommendations beyond what is mentioned above in the assessment and plan.  Meds ordered this encounter  Medications  . meloxicam (MOBIC) 15 MG tablet    Sig: Take 1 tablet (15 mg total) by mouth daily. Take after eating / with food    Dispense:  30 tablet    Refill:  5  . atorvastatin (LIPITOR) 20 MG tablet    Sig: Take 1 tablet (20 mg total) by mouth at bedtime.    Dispense:  30 tablet     Refill:  1    Orders Placed This Encounter  Procedures  . Lipid panel  . COMPLETE METABOLIC PANEL WITH GFR  . Ambulatory referral to Physical Therapy

## 2016-07-02 ENCOUNTER — Ambulatory Visit: Payer: Commercial Managed Care - PPO

## 2016-07-16 ENCOUNTER — Ambulatory Visit: Payer: Commercial Managed Care - PPO

## 2016-07-16 ENCOUNTER — Ambulatory Visit: Payer: Commercial Managed Care - PPO | Attending: Family Medicine

## 2016-07-16 VITALS — BP 169/92 | HR 62

## 2016-07-16 DIAGNOSIS — M545 Low back pain, unspecified: Secondary | ICD-10-CM

## 2016-07-16 NOTE — Patient Instructions (Addendum)
Adduction: Hip - Knees Together (Sitting)  Quadriceps Set (Prone)   With toes supporting lower legs, squeeze rear end muscles and tighten thigh muscles to straighten knees. Hold _5___ seconds. Relax. Repeat __10__ times per set. Do ___3_ sets per session. Do ___1_ sessions per day.  http://orth.exer.us/726   Copyright  VHI. All rights reserved.        Transversus abdominis contraction in sitting or standing.    Pretend that there is a string attached from one side of your pelvis to the other.    Tighten that "string."   Hold for 5 seconds.   Perform throughout the day.     Also add tightening your pelvic floor muscles with the exercise.

## 2016-07-16 NOTE — Therapy (Signed)
[facility] [address] [place], [address] Phone: [phone]   Fax:  [phone]  Physical Therapy Treatment  Patient Details  Name: Judith Olson MRN: [id number] Date of Birth: Jul 08, 1950 Referring Provider: Enid Derry, MD  Encounter Date: 07/16/2016      PT End of Session - 07/16/16 1515    Visit Number 1   Number of Visits 9   Date for PT Re-Evaluation 08/15/16   Authorization Type --   PT Start Time 1515   PT Stop Time 1619   PT Time Calculation (min) 64 min   Activity Tolerance Patient tolerated treatment well   Behavior During Therapy [facility] for tasks assessed/performed      Past Medical History:  Diagnosis Date  . Hypertension   . Muscle spasms of both lower extremities   . Scoliosis of lumbar spine 06/06/2016    Past Surgical History:  Procedure Laterality Date  . ABDOMINAL HYSTERECTOMY  2000    Vitals:   07/16/16 1527  BP: (!) 169/92  Pulse: 62        Subjective Assessment - 07/16/16 1524    Subjective Back pain: 4/10 currently (pt sitting, took pain medication today), 8/10 at worst.    Pertinent History Back pain. Sudden onset. Pt states that her back has been bothering her for a while. Pain comes and goes. Pain started in her L side. Had pain patches which did not help. 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 The pain medication that her current doctor gave her helps. No leg pain. Denies bowel or bladder problems or LE paresthesia.  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  Pt also does crunches, leg lifts, tricep dips. Tries to exercise.  Pt states that her back pain is getting better since taking the new pain medication.    Patient Stated Goals Pt expresses desire to get better.   Currently in Pain? Yes   Pain Score 4    Pain Location Back   Pain Orientation Lower    Pain Descriptors / Indicators --  squeezing, then release   Pain Type Chronic pain   Pain Onset More than a month ago   Pain Frequency Occasional   Aggravating Factors  bending forward, picking up parts for a transmission at work [employer]),    Pain Relieving Factors Bending her hips and knees to pick up stuff (ergonomic lifting), sitting, standing up straight.             [facility] PT Assessment - 07/16/16 1518      Assessment   Medical Diagnosis Spondylosis of lumbar region without myelopathy or radiculopathy; scoliosis of lumbar spine   Referring Provider Enid Derry, MD   Onset Date/Surgical Date 06/06/16  Date PT referral signed. Chronic condition. No specific date   Prior Therapy No known PT for current condition     Precautions   Precaution Comments aortoiliac artherosclerosis per radiograph report     Restrictions   Other Position/Activity Restrictions no known weight bearing restrictions     Balance Screen   Has the patient fallen in the past 6 months No   Has the patient had a decrease in activity level because of a fear of falling?  No   Is the patient reluctant to leave their home because of a fear of falling?  No  Home Environment   Additional Comments Patient lives in an apartment alone. no stairs     Prior Function   Vocation Full time employment;Part time employment  lifts car parts (transmission parts) for [employer]   Vocation Requirements PLOF: no back pain with lifting at least 10 lbs (car part average weight)     Observation/Other Assessments   Observations pt states taking her pain medication today. Good ergonomics with picking up 10 lbs from the floor.  (-) SLUMP bilaterally.    Modified [place] 2%     Posture/Postural Control   Posture Comments bilaterally protracted shoulders and neck, slight R lateral lean, bilateral foot pronation     AROM   Overall AROM Comments no pain with overpressure. Pt states taking pain medication   Lumbar Flexion full, no  pain   Lumbar Extension full, no pain   Lumbar - Right Side Bend WFL   Lumbar - Left Side Bend WFL   Lumbar - Right Rotation full   Lumbar - Left Rotation full      Strength   Right Hip Flexion 4/5   Right Hip Extension 4-/5   Right Hip ABduction 4/5   Left Hip Flexion 4/5   Left Hip Extension 4/5   Left Hip ABduction 4/5   Right Knee Flexion 4+/5   Right Knee Extension 5/5   Left Knee Flexion 4/5   Left Knee Extension 5/5     Ambulation/Gait   Gait Comments R lateral lean during R LE stance phase.  Increased anterior pelvic rotation during ipsilateral swing phase       Objectives  There-ex  Directed patient with prone glute max/quad set 10x2 with 5 second holds each LE  No back pain during exercises.   Supine transversus abdominis contraction 10x5 seconds  Then in sitting 10x5 seconds   Then in sitting with gentle pelvic floor contractions 10x5 seconds   Reviewed HEP. Pt demonstrated and verbalized understanding.   Improved exercise technique, movement at target joints, use of target muscles after mod verbal, visual, tactile cues.                                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    Baseline 8/10 at worst   Time 4   Period Weeks   Status New     PT LONG TERM GOAL #2   Title Patient will improve bilateral hip extension and abduction strength by at least 1/2 MMT grade to promote ability to perform standing tasks.    Time 4   Period Weeks   Status New               Plan - 07/16/16 1619    Clinical Impression Statement Patient is a 66 year old female who came to physical therapy secondary to back pain. She also presents with altered gait pattern and posture, hip weakness, decreased lumbopelvic control. Patient  will benefit from skilled physical therapy intervention to address the aforementioned deficits and to continue with decreasing back pain.    Rehab Potential Good   Clinical Impairments Affecting Rehab Potential (-) scoliosis; (+) motivation, decreased pain with medication   PT Frequency 2x /  week  1-2x/week    PT Duration 4 weeks  or less   PT Treatment/Interventions Therapeutic exercise;Therapeutic activities;Manual techniques;Dry needling;Patient/family education;Neuromuscular re-education;Ultrasound;Traction;Moist Heat;Iontophoresis 4mg /ml Dexamethasone;Electrical Stimulation;Aquatic Therapy   PT Next Visit Plan core strengthening, thoracic extension, scapular strengthening, lumbopelvic control, hip strengthening   Consulted and Agree with Plan of Care Patient      Patient will benefit from skilled therapeutic intervention in order to improve the following deficits and impairments:  Pain, Improper body mechanics, Decreased strength  Visit Diagnosis: Bilateral low back pain without sciatica, unspecified chronicity - Plan: PT plan of care cert/re-cert     Problem List Patient Active Problem List   Diagnosis Date Noted  . Aorto-iliac atherosclerosis ([place]) 06/06/2016  . 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 Scoliosis of lumbar spine 06/06/2016  . Medication monitoring encounter 06/06/2016  . Obesity 06/06/2016  . Other kyphosis of thoracic region 12/10/2015  . Preventative health care 12/10/2015  . Breast cancer screening 12/10/2015  . Need for shingles vaccine 12/10/2015  . Screening for HIV (human immunodeficiency virus) 12/10/2015  . Need for hepatitis C screening test 12/10/2015    [name] PT, DPT   07/16/2016, 5:15 PM  [facility] [address] [place], [address] Phone: [phone]   Fax:  [phone]  Name: Judith Olson MRN: [id number] Date of Birth:  03-31-1951

## 2016-07-18 ENCOUNTER — Ambulatory Visit: Payer: Commercial Managed Care - PPO | Attending: Family Medicine

## 2016-07-18 DIAGNOSIS — M545 Low back pain, unspecified: Secondary | ICD-10-CM

## 2016-07-18 NOTE — Patient Instructions (Signed)
Caudal Rotation: Hip Roll, Neutral Lordosis - Supine   Lie with knees bent, feet flat. Lower knees out to right side, rotating hips and trunk. Return. Repeat for other side. Repeat __10__ times per set. Do _3___ sets per session.    Copyright  VHI. All rights reserved.    

## 2016-07-18 NOTE — Therapy (Signed)
[place] [facility] [address], [place], [address] Phone: [phone]   Fax:  [phone]  Physical Therapy Treatment  Patient Details  Name: Judith Olson MRN: [id number] Date of Birth: 11-11-1950 Referring Provider: Enid Derry, MD  Encounter Date: 07/18/2016      PT End of Session - 07/18/16 1612    Visit Number 2   Number of Visits 9   Date for PT Re-Evaluation 08/15/16   PT Start Time [id number]   PT Stop Time 1655   PT Time Calculation (min) 41 min   Activity Tolerance Patient tolerated treatment well   Behavior During Therapy [facility] for tasks assessed/performed      Past Medical History:  Diagnosis Date  . Hypertension   . Muscle spasms of both lower extremities   . Scoliosis of lumbar spine 06/06/2016    Past Surgical History:  Procedure Laterality Date  . ABDOMINAL HYSTERECTOMY  2000    There were no vitals filed for this visit.      Subjective Assessment - 07/18/16 1614    Subjective Back is fine. Has a little pain because she did not take her pain medication today. Pain is on the L side which is the side where it originally started.  The reason it is hurting because the person who came before her at work left a mess that she had to clean up. The feeling of being upset/tension increased her back pain. 4.5/10 back pain currently.    Pertinent History Back pain. Sudden onset. Pt states that her back has been bothering her for a while. Pain comes and goes. Pain started in her L side. Had pain patches which did not help. 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 The pain medication that her current doctor gave her helps. No leg pain. Denies bowel or bladder problems or LE paresthesia.  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  Pt also does crunches, leg lifts, tricep dips. Tries to exercise.  Pt states  that her back pain is getting better since taking the new pain medication.    Patient Stated Goals Pt expresses desire to get better.   Currently in Pain? Yes   Pain Score 5   4.5/10   Pain Location Back   Pain Orientation Left;Posterior   Pain Descriptors / Indicators Sharp   Pain Onset More than a month ago            [facility] PT Assessment - 07/18/16 1624      AROM   Overall AROM Comments decresaed back pain afterwards, especially with overpressure to R side bending   Lumbar Flexion full, no pain   Lumbar Extension full, no pain   Lumbar - Right Side Bend WFL   Lumbar - Left Side Bend WFL   Lumbar - Right Rotation full   Lumbar - Left Rotation full                              PT Education - 07/18/16 1628    Education provided Yes   Education Details ther-ex, HEP   Person(s) Educated Patient   Methods Explanation;Demonstration;Tactile cues;Verbal cues;Handout   Comprehension Returned demonstration;Verbalized understanding        Objectives  There-ex  Directed patient with standing trunk flexion, extension, side  bend, rotation 1x each way, then with overpressure. No pain.   R trunk side bend 10x2 Supine lower trunk rotation 10x3  Decreased back pain to 1/10  Reviewed and given as part of her HEP. Pt was recommended to not over exert herself with her exercises. Pt verbalized understanding.    prone glute max/quad set 10x2 with 5 second holds each LE  Decreased back pain to 0.5/10           Improved exercise technique, movement at target joints, use of target muscles after min to mod verbal, visual, tactile cues.     Vitals obtained: 176/99, HR 74, L arm sitting, mechanically taken   172/110, L arm sitting, manually taken.   Rest provided  178/112 L arm sitting, manually taken after rest.   Session stopped secondary to elevated levels.    No lightheadedness or dizziness throughout session, asymptomatic.   Pt states that her MD  told her that she did not have to take her blood pressure medication because she lost a lot of weight. Pt was recommended to talk to her MD about her levels. Pt states she will see her MD on Monday about it.   Pt was also recommended to call her MD about her blood pressure today or tomorrow to see what she wants to do. Pt verbalized understanding.      Improved exercise technique, movement at target joints, use of target muscles after min to mod verbal, visual, tactile cues.     Back pain decreased to 0.5 to 1/10 after session with rotational movements and activation of glute max muscles. Pt demonstrates elevated blood pressure levels, asymptomatic. Pt was recommended to contact her MD about her levels. Pt verbalized understanding.        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    Baseline 8/10 at worst   Time 4   Period Weeks   Status New     PT LONG TERM GOAL #2   Title Patient will improve bilateral hip extension and abduction strength by at least 1/2 MMT grade to promote ability to perform standing tasks.    Time 4   Period Weeks   Status New               Plan - 07/18/16 1630    Clinical Impression Statement Back pain decreased to 0.5 to 1/10 after session with rotational movements and activation of glute max muscles. Pt demonstrates elevated blood pressure levels, asymptomatic. Pt was recommended to contact her MD about her levels. Pt verbalized understanding.    Rehab Potential Good   Clinical Impairments Affecting Rehab Potential (-) scoliosis; (+) motivation, decreased pain with medication   PT Frequency 2x / week  1-2x/week    PT Duration 4 weeks  or less   PT Treatment/Interventions Therapeutic exercise;Therapeutic activities;Manual techniques;Dry  needling;Patient/family education;Neuromuscular re-education;Ultrasound;Traction;Moist Heat;Iontophoresis 4mg /ml Dexamethasone;Electrical Stimulation;Aquatic Therapy   PT Next Visit Plan core strengthening, thoracic extension, scapular strengthening, lumbopelvic control, hip strengthening   Consulted and Agree with Plan of Care Patient      Patient will benefit from skilled therapeutic intervention in order to improve the following deficits and impairments:  Pain, Improper body mechanics, Decreased strength  Visit Diagnosis: Bilateral low back pain without  sciatica, unspecified chronicity     Problem List Patient Active Problem List   Diagnosis Date Noted  . Aorto-iliac atherosclerosis ([place]) 06/06/2016  . 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 Scoliosis of lumbar spine 06/06/2016  . Medication monitoring encounter 06/06/2016  . Obesity 06/06/2016  . Other kyphosis of thoracic region 12/10/2015  . Preventative health care 12/10/2015  . Breast cancer screening 12/10/2015  . Need for shingles vaccine 12/10/2015  . Screening for HIV (human immunodeficiency virus) 12/10/2015  . Need for hepatitis C screening test 12/10/2015    [name] PT, DPT   07/18/2016, 5:22 PM  [place] [facility] [address], [place], [address] Phone: [phone]   Fax:  [phone]  Name: Judith Olson MRN: [id number] Date of Birth: [birth year]

## 2016-07-22 ENCOUNTER — Telehealth: Payer: Self-pay

## 2016-07-25 ENCOUNTER — Ambulatory Visit: Payer: Commercial Managed Care - PPO

## 2016-07-25 DIAGNOSIS — M545 Low back pain, unspecified: Secondary | ICD-10-CM

## 2016-07-25 NOTE — Therapy (Signed)
[place] [facility] [address], [place], [address] Phone: [phone]   Fax:  [phone]  Physical Therapy Treatment  Patient Details  Name: Judith Olson MRN: [id number] Date of Birth: 09-23-1950 Referring Provider: Enid Derry, MD  Encounter Date: 07/25/2016      PT End of Session - 07/25/16 1558    Visit Number 3   Number of Visits 9   Date for PT Re-Evaluation 08/15/16   PT Start Time 1559  pt arrived late   PT Stop Time 1629   PT Time Calculation (min) 30 min   Activity Tolerance Patient tolerated treatment well   Behavior During Therapy [facility] for tasks assessed/performed      Past Medical History:  Diagnosis Date  . Hypertension   . Muscle spasms of both lower extremities   . Scoliosis of lumbar spine 06/06/2016    Past Surgical History:  Procedure Laterality Date  . ABDOMINAL HYSTERECTOMY  2000    There were no vitals filed for this visit.      Subjective Assessment - 07/25/16 1601    Subjective Pt states that she gave a call to her doctor about her blood pressure and was told to stop by the office when she is able to.  Back is doing good. Has been doing her exercises. Not overexerting herself.  Took pain medication this morning at 5 am. Usually takes it every 3 days.    Pertinent History Back pain. Sudden onset. Pt states that her back has been bothering her for a while. Pain comes and goes. Pain started in her L side. Had pain patches which did not help. 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 The pain medication that her current doctor gave her helps. No leg pain. Denies bowel or bladder problems or LE paresthesia.  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  Pt also does crunches, leg lifts, tricep dips. Tries to exercise.  Pt states that her back pain is getting better since taking the new  pain medication.    Patient Stated Goals Pt expresses desire to get better.   Currently in Pain? No/denies   Pain Score 0-No pain   Pain Onset More than a month ago                                 PT Education - 07/25/16 1615    Education provided Yes   Education Details ther-ex   [employer]) Educated Patient   Methods Explanation;Demonstration;Tactile cues;Verbal cues   Comprehension Verbalized understanding;Returned demonstration        Objectives  There-ex  Vitals obtained:   Blood pressure L arm sitting: 170/84, HR 70. Asymptomatic.   Directed patient with standing bilateral scapular retraction 10x5 seconds for 5 sets Seated gentle thoracic extension at chair 10x2  Standing hip extension 10x2 each LE  Standing hip abduction 10x2 each LE  Standing trunk rotations 10x each direction. Per pt, it feels good for her back.    Improved exercise technique, movement at target joints, use of target muscles after mod verbal, visual, tactile cues.    Light exercises with frequent rest breaks performed to not over work pt due to vitals. Pt making very good progress towards pain goals. Good carry over of symptom improvement from  last session.          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    Baseline 8/10 at worst   Time 4   Period Weeks   Status New     PT LONG TERM GOAL #2   Title Patient will improve bilateral hip extension and abduction strength by at least 1/2 MMT grade to promote ability to perform standing tasks.    Time 4   Period Weeks   Status New               Plan - 07/25/16 1558    Clinical Impression Statement Light exercises with frequent rest breaks performed to not over work pt due to vitals. Pt making very good  progress towards pain goals. Good carry over of symptom improvement from last session.    Rehab Potential Good   Clinical Impairments Affecting Rehab Potential (-) scoliosis; (+) motivation, decreased pain with medication   PT Frequency 2x / week  1-2x/week    PT Duration 4 weeks  or less   PT Treatment/Interventions Therapeutic exercise;Therapeutic activities;Manual techniques;Dry needling;Patient/family education;Neuromuscular re-education;Ultrasound;Traction;Moist Heat;Iontophoresis 4mg /ml Dexamethasone;Electrical Stimulation;Aquatic Therapy   PT Next Visit Plan core strengthening, thoracic extension, scapular strengthening, lumbopelvic control, hip strengthening   Consulted and Agree with Plan of Care Patient      Patient will benefit from skilled therapeutic intervention in order to improve the following deficits and impairments:  Pain, Improper body mechanics, Decreased strength  Visit Diagnosis: Bilateral low back pain without sciatica, unspecified chronicity     Problem List Patient Active Problem List   Diagnosis Date Noted  . Aorto-iliac atherosclerosis ([place]) 06/06/2016  . 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 Scoliosis of lumbar spine 06/06/2016  . Medication monitoring encounter 06/06/2016  . Obesity 06/06/2016  . Other kyphosis of thoracic region 12/10/2015  . Preventative health care 12/10/2015  . Breast cancer screening 12/10/2015  . Need for shingles vaccine 12/10/2015  . Screening for HIV (human immunodeficiency virus) 12/10/2015  . Need for hepatitis C screening test 12/10/2015    [name] PT, DPT   07/25/2016, 4:47 PM  [place] [facility] [address], [place], [address] Phone: [phone]   Fax:  [phone]  Name: Judith Olson MRN: [id number] Date of Birth: 10-31-1950

## 2016-07-25 NOTE — Patient Instructions (Signed)
  Reviewed and given standing trunk rotation as part of her HEP. Pt demonstrated and verbalized understanding.

## 2016-07-29 ENCOUNTER — Ambulatory Visit: Payer: Commercial Managed Care - PPO

## 2016-07-29 DIAGNOSIS — M545 Low back pain, unspecified: Secondary | ICD-10-CM

## 2016-07-29 NOTE — Therapy (Signed)
[place] [facility] [address] [place], [address] Phone: [phone]   Fax:  [phone]  Physical Therapy Treatment  Patient Details  Name: Judith Olson MRN: [id number] Date of Birth: [birth year] Referring Provider: Enid Derry, MD  Encounter Date: 07/29/2016      PT End of Session - 07/29/16 1704    Visit Number 4   Number of Visits 9   Date for PT Re-Evaluation 08/15/16   PT Start Time 1703   PT Stop Time 1718   PT Time Calculation (min) 15 min   Activity Tolerance Patient tolerated treatment well   Behavior During Therapy [facility] for tasks assessed/performed      Past Medical History:  Diagnosis Date  . Hypertension   . Muscle spasms of both lower extremities   . Scoliosis of lumbar spine 06/06/2016    Past Surgical History:  Procedure Laterality Date  . ABDOMINAL HYSTERECTOMY  2000    There were no vitals filed for this visit.      Subjective Assessment - 07/29/16 1712    Subjective Pt states that she tries to eat healthy: bake, broil, or boil her foot (brocolli, fish, potato). Tries not eat processed food. Seeing her MD tomorrow for her blood pressure.  Back feels pretty good. No pain currently. Took her pain medication Friday.  0/10 back pain at worst for the past 3 days without the pain medicaiton.  Pt states the exercises really help and feels better afterwards.    Pertinent History Back pain. Sudden onset. Pt states that her back has been bothering her for a while. Pain comes and goes. Pain started in her L side. Had pain patches which did not help. 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 The pain medication that her current doctor gave her helps. No leg pain. Denies bowel or bladder problems or LE paresthesia.  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  Pt also does crunches, leg lifts,  tricep dips. Tries to exercise.  Pt states that her back pain is getting better since taking the new pain medication.    Patient Stated Goals Pt expresses desire to get better.   Currently in Pain? No/denies   Pain Score 0-No pain   Pain Onset More than a month ago                                 PT Education - 07/29/16 1726    Education provided Yes   Education Details blood pressure levels and activity.    Person(s) Educated Patient   Methods Explanation   Comprehension Verbalized understanding        Objectives  There-ex  Vitals obtained:   Blood pressure L arm sitting: 176/98, HR 73. Asymptomatic.  172/118 manually taken L arm sitting    A few min later: 190/ 118 L arm sitting, manually taken.   Pt was recommended to not exert herself with her HEP and to stop and go to the ER if she feels symptoms such as light headedness, dizziness, blurred vision, weakness or slurred speech. Pt was recommended to hold off on HEP for now due to elevated blood pressure levels. Pt demonstrated and verbalized understanding.              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    Baseline 8/10 at worst   Time 4   Period Weeks   Status New     PT LONG TERM GOAL #2   Title Patient will improve bilateral hip extension and abduction strength by at least 1/2 MMT grade to promote ability to perform standing tasks.    Time 4   Period Weeks   Status New               Plan - 07/29/16 1704    Clinical Impression Statement Physical therapy not performed today secondary to blood pressure levels not appropriate for therapy at this time. Asymptomatic. No complain of back pain for the past 3 days. Pt making very good progress with PT towards goals of  decreased back pain.    Rehab Potential Good   Clinical Impairments Affecting Rehab Potential (-) scoliosis; (+) motivation, decreased pain with medication   PT Frequency 2x / week  1-2x/week    PT Duration 4 weeks  or less   PT Treatment/Interventions Therapeutic exercise;Therapeutic activities;Manual techniques;Dry needling;Patient/family education;Neuromuscular re-education;Ultrasound;Traction;Moist Heat;Iontophoresis 4mg /ml Dexamethasone;Electrical Stimulation;Aquatic Therapy   PT Next Visit Plan core strengthening, thoracic extension, scapular strengthening, lumbopelvic control, hip strengthening   Consulted and Agree with Plan of Care Patient      Patient will benefit from skilled therapeutic intervention in order to improve the following deficits and impairments:  Pain, Improper body mechanics, Decreased strength  Visit Diagnosis: Bilateral low back pain without sciatica, unspecified chronicity     Problem List Patient Active Problem List   Diagnosis Date Noted  . Aorto-iliac atherosclerosis ([place]) 06/06/2016  . 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 Scoliosis of lumbar spine 06/06/2016  . Medication monitoring encounter 06/06/2016  . Obesity 06/06/2016  . Other kyphosis of thoracic region 12/10/2015  . Preventative health care 12/10/2015  . Breast cancer screening 12/10/2015  . Need for shingles vaccine 12/10/2015  . Screening for HIV (human immunodeficiency virus) 12/10/2015  . Need for hepatitis C screening test 12/10/2015    [name] PT, DPT   07/29/2016, 5:31 PM  [place] [facility] [address] [place], [address] Phone: [phone]   Fax:  [phone]  Name: Judith Olson MRN: [id number] Date of Birth: May 30, 1951

## 2016-07-30 ENCOUNTER — Ambulatory Visit (INDEPENDENT_AMBULATORY_CARE_PROVIDER_SITE_OTHER): Payer: Commercial Managed Care - PPO | Admitting: Family Medicine

## 2016-07-30 ENCOUNTER — Encounter: Payer: Self-pay | Admitting: Family Medicine

## 2016-07-30 DIAGNOSIS — I10 Essential (primary) hypertension: Secondary | ICD-10-CM | POA: Diagnosis not present

## 2016-07-30 MED ORDER — LISINOPRIL 20 MG PO TABS: 20.0000 mg | ORAL_TABLET | Freq: Every day | ORAL | 0 refills | Status: DC

## 2016-07-30 MED ORDER — CHLORTHALIDONE 25 MG PO TABS: 25.0000 mg | ORAL_TABLET | Freq: Every day | ORAL | 0 refills | Status: DC

## 2016-07-30 NOTE — Progress Notes (Signed)
BP (!) 162/94   Pulse 65   Temp 98.2 F (36.8 C) (Oral)   Wt 171 lb 12.8 oz (77.9 kg)   SpO2 97%   BMI 31.42 kg/m    Subjective:    Patient ID: Judith Olson, female    DOB: 1950-11-01, 66 y.o.   MRN: [id number]  HPI: Judith Olson is a 66 y.o. female  Chief Complaint  Patient presents with  . Hypertension    Patient has been seeing chiropracter and blood pressure has been running high.  She was told to tell PCP.  Today she walked in to do nurse visit for bp check reading was 162/94.  She has not followup with appointments and has stopped taking bp meds.  I consulted with Dr. Sanda Klein   Her BP was 179/98 yesterday and the chiropractor was a little bit concerned; let him sit and relax; 179/99 again; then it was [id number] diastolic She feels fine; no complaints No headache, no chest pain, no leg edema Taking meloxicam about 3 days at a time, then off a few days No salt No decongestants Last BP medicine was sometime last year; she ran out and just didn't get them again  Depression screen [facility] 2/9 06/06/2016 11/14/2015  Decreased Interest 0 0  Down, Depressed, Hopeless 0 0  PHQ - 2 Score 0 0   Relevant past medical, surgical, family and social history reviewed Past Medical History:  Diagnosis Date  . Essential hypertension, benign 08/04/2016  . Hypertension   . Muscle spasms of both lower extremities   . Scoliosis of lumbar spine 06/06/2016   Past Surgical History:  Procedure Laterality Date  . ABDOMINAL HYSTERECTOMY  2000   Family History  Problem Relation Age of Onset  . Cancer Father     porstate  . Cancer Sister     breast  . Hypertension Neg Hx    Social History  Substance Use Topics  . Smoking status: Former Smoker    Types: Cigarettes  . Smokeless tobacco: Never Used  . Alcohol use 0.0 oz/week     Comment: not regularly   Interim medical history since last visit reviewed. Allergies and medications reviewed  Review of Systems Per HPI unless specifically  indicated above     Objective:    BP (!) 162/94   Pulse 65   Temp 98.2 F (36.8 C) (Oral)   Wt 171 lb 12.8 oz (77.9 kg)   SpO2 97%   BMI 31.42 kg/m   Wt Readings from Last 3 Encounters:  07/30/16 171 lb 12.8 oz (77.9 kg)  06/06/16 169 lb (76.7 kg)  05/15/16 186 lb (84.4 kg)    Physical Exam  Constitutional: She appears well-developed and well-nourished.  HENT:  Mouth/Throat: Mucous membranes are normal.  Eyes: EOM are normal. No scleral icterus.  Cardiovascular: Normal rate and regular rhythm.   Pulmonary/Chest: Effort normal and breath sounds normal.  Psychiatric: She has a normal mood and affect. Her behavior is normal.    Results for orders placed or performed in visit on 11/14/15  CBC with Differential/Platelet  Result Value Ref Range   WBC 5.1 3.4 - 10.8 x10E3/uL   RBC 4.56 3.77 - 5.28 x10E6/uL   Hemoglobin 12.4 11.1 - 15.9 g/dL   Hematocrit 38.1 34.0 - 46.6 %   MCV 84 79 - 97 fL   MCH 27.2 26.6 - 33.0 pg   MCHC 32.5 31.5 - 35.7 g/dL   RDW 15.3 12.3 - 15.4 %   Platelets 222  150 - 379 x10E3/uL   Neutrophils 44 %   Lymphs 47 %   Monocytes 8 %   Eos 1 %   Basos 0 %   Neutrophils Absolute 2.3 1.4 - 7.0 x10E3/uL   Lymphocytes Absolute 2.4 0.7 - 3.1 x10E3/uL   Monocytes Absolute 0.4 0.1 - 0.9 x10E3/uL   EOS (ABSOLUTE) 0.1 0.0 - 0.4 x10E3/uL   Basophils Absolute 0.0 0.0 - 0.2 x10E3/uL   Immature Granulocytes 0 %   Immature Grans (Abs) 0.0 0.0 - 0.1 x10E3/uL  Comprehensive metabolic panel  Result Value Ref Range   Glucose 95 65 - 99 mg/dL   BUN 12 8 - 27 mg/dL   Creatinine, Ser 0.92 0.57 - 1.00 mg/dL   GFR calc non Af Amer 67 >59 mL/min/1.73   GFR calc Af Amer 77 >59 mL/min/1.73   BUN/Creatinine Ratio 13 12 - 28   Sodium 140 134 - 144 mmol/L   Potassium 3.9 3.5 - 5.2 mmol/L   Chloride 99 96 - 106 mmol/L   CO2 25 18 - 29 mmol/L   Calcium 10.2 8.7 - 10.3 mg/dL   Total Protein 7.1 6.0 - 8.5 g/dL   Albumin 4.3 3.6 - 4.8 g/dL   Globulin, Total 2.8 1.5 - 4.5  g/dL   Albumin/Globulin Ratio 1.5 1.2 - 2.2   Bilirubin Total 0.5 0.0 - 1.2 mg/dL   Alkaline Phosphatase 90 39 - 117 IU/L   AST 14 0 - 40 IU/L   ALT 10 0 - 32 IU/L  Lipid Panel w/o Chol/HDL Ratio  Result Value Ref Range   Cholesterol, Total 175 100 - 199 mg/dL   Triglycerides 89 0 - 149 mg/dL   HDL 48 >39 mg/dL   VLDL Cholesterol Cal 18 5 - 40 mg/dL   LDL Calculated 109 (H) 0 - 99 mg/dL  HIV antibody  Result Value Ref Range   HIV Screen 4th Generation wRfx Non Reactive Non Reactive  Hepatitis C antibody  Result Value Ref Range   Hep C Virus Ab <0.1 0.0 - 0.9 s/co ratio  TSH  Result Value Ref Range   TSH 0.767 0.450 - 4.500 uIU/mL      Assessment & Plan:   Problem List Items Addressed This Visit      Cardiovascular and Mediastinum   Essential hypertension, benign    Discussed DASH guidelines; avoid decongestants; meds as listed; return in 2 weeks for recheck and BMP      Relevant Medications   chlorthalidone (HYGROTON) 25 MG tablet   lisinopril (PRINIVIL,ZESTRIL) 20 MG tablet       Follow up plan: Return in about 2 weeks (around 08/13/2016) for blood pressure and fasting labs with Dr. Sanda Klein.  An after-visit summary was printed and given to the patient at [place].  Please see the patient instructions which may contain other information and recommendations beyond what is mentioned above in the assessment and plan.  Meds ordered this encounter  Medications  . chlorthalidone (HYGROTON) 25 MG tablet    Sig: Take 1 tablet (25 mg total) by mouth daily.    Dispense:  30 tablet    Refill:  0  . lisinopril (PRINIVIL,ZESTRIL) 20 MG tablet    Sig: Take 1 tablet (20 mg total) by mouth daily.    Dispense:  30 tablet    Refill:  0    No orders of the defined types were placed in this encounter.

## 2016-07-30 NOTE — Patient Instructions (Addendum)
Start back on the two blood pressure medicines Your goal blood pressure is less than 150 mmHg on top. Try to follow the DASH guidelines (DASH stands for Dietary Approaches to Stop Hypertension) Try to limit the sodium in your diet.  Ideally, consume less than 1.5 grams (less than 1,500mg ) per day. Do not add salt when cooking or at the table.  Check the sodium amount on labels when shopping, and choose items lower in sodium when given a choice. Avoid or limit foods that already contain a lot of sodium. Eat a diet rich in fruits and vegetables and whole grains. Return in 2 weeks for follow-up Try to use PLAIN allergy medicine without the decongestant Avoid: phenylephrine, phenylpropanolamine, and pseudoephredine Avoid salt substitutes DASH Eating Plan DASH stands for "Dietary Approaches to Stop Hypertension." The DASH eating plan is a healthy eating plan that has been shown to reduce high blood pressure (hypertension). Additional health benefits may include reducing the risk of type 2 diabetes mellitus, heart disease, and stroke. The DASH eating plan may also help with weight loss. What do I need to know about the DASH eating plan? For the DASH eating plan, you will follow these general guidelines:  Choose foods with less than 150 milligrams of sodium per serving (as listed on the food label).  Use salt-free seasonings or herbs instead of table salt or sea salt.  Check with your health care provider or pharmacist before using salt substitutes.  Eat lower-sodium products. These are often labeled as "low-sodium" or "no salt added."  Eat fresh foods. Avoid eating a lot of canned foods.  Eat more vegetables, fruits, and low-fat dairy products.  Choose whole grains. Look for the word "whole" as the first word in the ingredient list.  Choose fish and skinless chicken or [place] more often than red meat. Limit fish, poultry, and meat to 6 oz (170 g) each day.  Limit sweets, desserts, sugars, and  sugary drinks.  Choose heart-healthy fats.  Eat more home-cooked food and less restaurant, buffet, and fast food.  Limit fried foods.  Do not fry foods. Cook foods using methods such as baking, boiling, grilling, and broiling instead.  When eating at a restaurant, ask that your food be prepared with less salt, or no salt if possible. What foods can I eat? Seek help from a dietitian for individual calorie needs. Grains  Whole grain or whole wheat bread. Brown rice. Whole grain or whole wheat pasta. Quinoa, bulgur, and whole grain cereals. Low-sodium cereals. Corn or whole wheat flour tortillas. Whole grain cornbread. Whole grain crackers. Low-sodium crackers. Vegetables  Fresh or frozen vegetables (raw, steamed, roasted, or grilled). Low-sodium or reduced-sodium tomato and vegetable juices. Low-sodium or reduced-sodium tomato sauce and paste. Low-sodium or reduced-sodium canned vegetables. Fruits  All fresh, canned (in natural juice), or frozen fruits. Meat and Other Protein Products  Ground beef (85% or leaner), grass-fed beef, or beef trimmed of fat. Skinless chicken or [place]. Ground chicken or [place]. Pork trimmed of fat. All fish and seafood. Eggs. Dried beans, peas, or lentils. Unsalted nuts and seeds. Unsalted canned beans. Dairy  Low-fat dairy products, such as skim or 1% milk, 2% or reduced-fat cheeses, low-fat ricotta or cottage cheese, or plain low-fat yogurt. Low-sodium or reduced-sodium cheeses. Fats and Oils  Tub margarines without trans fats. Light or reduced-fat mayonnaise and salad dressings (reduced sodium). Avocado. Safflower, olive, or canola oils. Natural peanut or almond butter. Other  Unsalted popcorn and pretzels. The items listed above may  not be a complete list of recommended foods or beverages. Contact your dietitian for more options.  What foods are not recommended? Grains  White bread. White pasta. White rice. Refined cornbread. Bagels and croissants.  Crackers that contain trans fat. Vegetables  Creamed or fried vegetables. Vegetables in a cheese sauce. Regular canned vegetables. Regular canned tomato sauce and paste. Regular tomato and vegetable juices. Fruits  Canned fruit in light or heavy syrup. Fruit juice. Meat and Other Protein Products  Fatty cuts of meat. Ribs, chicken wings, bacon, sausage, bologna, salami, chitterlings, fatback, hot dogs, bratwurst, and packaged luncheon meats. Salted nuts and seeds. Canned beans with salt. Dairy  Whole or 2% milk, cream, half-and-half, and cream cheese. Whole-fat or sweetened yogurt. Full-fat cheeses or blue cheese. Nondairy creamers and whipped toppings. Processed cheese, cheese spreads, or cheese curds. Condiments  Onion and garlic salt, seasoned salt, table salt, and sea salt. Canned and packaged gravies. Worcestershire sauce. Tartar sauce. Barbecue sauce. Teriyaki sauce. Soy sauce, including reduced sodium. Steak sauce. Fish sauce. Oyster sauce. Cocktail sauce. Horseradish. Ketchup and mustard. Meat flavorings and tenderizers. Bouillon cubes. Hot sauce. Tabasco sauce. Marinades. Taco seasonings. Relishes. Fats and Oils  Butter, stick margarine, lard, shortening, ghee, and bacon fat. Coconut, palm kernel, or palm oils. Regular salad dressings. Other  Pickles and olives. Salted popcorn and pretzels. The items listed above may not be a complete list of foods and beverages to avoid. Contact your dietitian for more information.  Where can I find more information? National Heart, Lung, and Blood Institute: travelstabloid.com This information is not intended to replace advice given to you by your health care provider. Make sure you discuss any questions you have with your health care provider. Document Released: 05/23/2011 Document Revised: 11/09/2015 Document Reviewed: 04/07/2013 Elsevier Interactive Patient Education  2017 [employer].

## 2016-07-31 ENCOUNTER — Ambulatory Visit: Payer: Commercial Managed Care - PPO

## 2016-08-01 ENCOUNTER — Ambulatory Visit: Payer: Commercial Managed Care - PPO

## 2016-08-01 DIAGNOSIS — M545 Low back pain, unspecified: Secondary | ICD-10-CM

## 2016-08-01 NOTE — Patient Instructions (Signed)
  Gave seated lateral pelvic tilts as part of her HEP 10x3 daily. Pt demonstrated and verbalized understanding.

## 2016-08-01 NOTE — Therapy (Signed)
[facility] [address] [place], [address] Phone: [phone]   Fax:  [phone]  Physical Therapy Treatment  Patient Details  Name: Judith Olson MRN: [id number] Date of Birth: 04/13/1951 Referring Provider: Enid Derry, MD  Encounter Date: 08/01/2016      PT End of Session - 08/01/16 1702    Visit Number 5   Number of Visits 9   Date for PT Re-Evaluation 08/15/16   PT Start Time 1702   PT Stop Time 1732   PT Time Calculation (min) 30 min   Activity Tolerance Patient tolerated treatment well   Behavior During Therapy [facility] for tasks assessed/performed      Past Medical History:  Diagnosis Date  . Hypertension   . Muscle spasms of both lower extremities   . Scoliosis of lumbar spine 06/06/2016    Past Surgical History:  Procedure Laterality Date  . ABDOMINAL HYSTERECTOMY  2000    There were no vitals filed for this visit.      Subjective Assessment - 08/01/16 1703    Subjective Went to her MD Tuesday and started taking her blood pressure medication.  Levels might be high today because she is currently having a situation at work.  Back is ok. No pain currently.  Took 2 tylenol today due to a headache 6 hours ago. Only takes her pain medication every 3 days.  Next MD appointment is on 08/08/2016. Takes her blood pressure medicine at night.  Back has been a 0/10 at worst for the past 7 days.    Pertinent History Back pain. Sudden onset. Pt states that her back has been bothering her for a while. Pain comes and goes. Pain started in her L side. Had pain patches which did not help. 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 The pain medication that her current doctor gave her helps. No leg pain. Denies bowel or bladder problems or LE paresthesia.  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  Pt also  does crunches, leg lifts, tricep dips. Tries to exercise.  Pt states that her back pain is getting better since taking the new pain medication.    Patient Stated Goals Pt expresses desire to get better.   Currently in Pain? No/denies   Pain Score 0-No pain   Pain Onset More than a month ago                                 PT Education - 08/01/16 1726    Education provided Yes   Education Details ther-ex   [employer]) Educated Patient   Methods Explanation;Demonstration;Tactile cues;Verbal cues   Comprehension Returned demonstration;Verbalized understanding        Objectives   There-ex   Blood pressure L arm sitting: 173/93, HR 76, mechanically taken  Sitting with lumbar towel roll x 2 min to promote gentle lumbar extension and mobility  sitting with thoracic towel roll x 2 min to promote thoracic extension   Reviewed ergonomic lifting. Pt verbalized understanding.   Blood pressure L arm sitting, mechanically taken: 166/79, HR 68  Standing hip extension with posterior pelvic tilting 10x2 each LE  Seated anterior and posterior pelvic tilts 10x3   Seated lateral pelvic tilts 10x3 each side. Pt feels the exercise helps  relax her back   Improved exercise technique, movement at target joints, use of target muscles after mod verbal, visual, tactile cues.    Pt continues to have no back pain since performing her exercises and taking pain medication only every 3 days. Patient making very good progress with PT towards pain goals. Light session secondary to elevated vitals signs (within range for PT).        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    Baseline 8/10 at worst   Time 4   Period Weeks   Status New     PT LONG TERM GOAL #2   Title  Patient will improve bilateral hip extension and abduction strength by at least 1/2 MMT grade to promote ability to perform standing tasks.    Time 4   Period Weeks   Status New               Plan - 08/01/16 1702    Clinical Impression Statement Pt continues to have no back pain since performing her exercises and taking pain medication only every 3 days. Patient making very good progress with PT towards pain goals. Light session secondary to elevated vitals signs (within range for PT).    Rehab Potential Good   Clinical Impairments Affecting Rehab Potential (-) scoliosis; (+) motivation, decreased pain with medication   PT Frequency 2x / week  1-2x/week    PT Duration 4 weeks  or less   PT Treatment/Interventions Therapeutic exercise;Therapeutic activities;Manual techniques;Dry needling;Patient/family education;Neuromuscular re-education;Ultrasound;Traction;Moist Heat;Iontophoresis 4mg /ml Dexamethasone;Electrical Stimulation;Aquatic Therapy   PT Next Visit Plan core strengthening, thoracic extension, scapular strengthening, lumbopelvic control, hip strengthening   Consulted and Agree with Plan of Care Patient      Patient will benefit from skilled therapeutic intervention in order to improve the following deficits and impairments:  Pain, Improper body mechanics, Decreased strength  Visit Diagnosis: Bilateral low back pain without sciatica, unspecified chronicity     Problem List Patient Active Problem List   Diagnosis Date Noted  . Aorto-iliac atherosclerosis ([place]) 06/06/2016  . 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 Scoliosis of lumbar spine 06/06/2016  . Medication monitoring encounter 06/06/2016  . Obesity 06/06/2016  . Other kyphosis of thoracic region 12/10/2015  . Preventative health care 12/10/2015  . Breast cancer screening 12/10/2015  . Need for shingles vaccine 12/10/2015  . Screening for HIV (human immunodeficiency virus) 12/10/2015   . Need for hepatitis C screening test 12/10/2015    [name] PT, DPT   08/01/2016, 5:40 PM  [place] [place] [address], [place], [address] Phone: [phone]   Fax:  [phone]  Name: Rhiley Gaylor MRN: [id number] Date of Birth: 04/13/1951

## 2016-08-04 ENCOUNTER — Encounter: Payer: Self-pay | Admitting: Family Medicine

## 2016-08-04 DIAGNOSIS — I10 Essential (primary) hypertension: Secondary | ICD-10-CM

## 2016-08-04 HISTORY — DX: Essential (primary) hypertension: I10

## 2016-08-04 NOTE — Assessment & Plan Note (Signed)
Discussed DASH guidelines; avoid decongestants; meds as listed; return in 2 weeks for recheck and BMP

## 2016-08-06 ENCOUNTER — Ambulatory Visit: Payer: Commercial Managed Care - PPO

## 2016-08-06 DIAGNOSIS — M545 Low back pain, unspecified: Secondary | ICD-10-CM

## 2016-08-06 NOTE — Patient Instructions (Signed)
Strengthening: Resisted Extension    Hold band, one in each hand, arms forward. Pull arms back, elbow straight.   Repeat ___10_ times per set. Do _3___ sets per session. Do _1__ sessions per day.  http://orth.exer.us/832   Copyright  VHI. All rights reserved.

## 2016-08-06 NOTE — Therapy (Signed)
[place] [facility] [address] [place], [address] Phone: [phone]   Fax:  [phone]  Physical Therapy Treatment  Patient Details  Name: Judith Olson MRN: [id number] Date of Birth: 07/14/1950 Referring Provider: Enid Derry, MD  Encounter Date: 08/06/2016      PT End of Session - 08/06/16 1711    Visit Number 6   Number of Visits 9   Date for PT Re-Evaluation 08/15/16   PT Start Time 1711   PT Stop Time 1751   PT Time Calculation (min) 40 min   Activity Tolerance Patient tolerated treatment well   Behavior During Therapy [facility] for tasks assessed/performed      Past Medical History:  Diagnosis Date  . Essential hypertension, benign 08/04/2016  . Hypertension   . Muscle spasms of both lower extremities   . Scoliosis of lumbar spine 06/06/2016    Past Surgical History:  Procedure Laterality Date  . ABDOMINAL HYSTERECTOMY  2000    There were no vitals filed for this visit.      Subjective Assessment - 08/06/16 1715    Subjective Still takes her back pain medication every 3 days. Has not taken her back pain medication yesterday. No pain currently. 0/10 back pain at most for the past 7 days.    Pertinent History Back pain. Sudden onset. Pt states that her back has been bothering her for a while. Pain comes and goes. Pain started in her L side. Had pain patches which did not help. 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 The pain medication that her current doctor gave her helps. No leg pain. Denies bowel or bladder problems or LE paresthesia.  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  Pt also does crunches, leg lifts, tricep dips. Tries to exercise.  Pt states that her back pain is getting better since taking the new pain medication.    Patient Stated Goals Pt expresses desire to get better.   Currently in Pain? No/denies   Pain Score 0-No pain   Pain Onset More than a month ago                                 PT Education - 08/06/16 1727    Education provided Yes   Education Details ther-ex, HEP   Person(s) Educated Patient   Methods Explanation;Demonstration;Tactile cues;Verbal cues;Handout   Comprehension Returned demonstration;Verbalized understanding        Objectives   There-ex   Blood pressure L arm sitting: 147/77, HR 67, mechanically taken  Directed patient with standing bilateral shoulder extension with scapular retraction 10x3  Sitting on physioball: anterior and posterior pelvic tilts 10x3  Lateral pelvic tilts 10x3 each side  Circles: clockwise 10x3, counterclockwise 10x3  Standing bilateral scapular retraction rows resisting yellow band 10x3 to promote thoracic extension and scapular strength   T-band side stepping 32 ft to the R and 32 ft to the L to promote glute med strength 2x each direction.   Standing pallof press resisting yellow band 10x5 seconds for 2 sets for each side to promote trunk strengthening.   Slightly more difficulty resisting R trunk rotation compared to resisting L trunk rotation.     Improved exercise technique, movement at target joints, use of target muscles after  mod verbal, visual, tactile cues.    Continued working on lumbar and thoracic mobility in addition to trunk and hip strengthening to help decrease back pain.  No complain of back pain throughout session. Pt also demonstrates limited thoracic extension as well as R lateral shift posture in standing.          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    Baseline 8/10 at worst   Time 4   Period Weeks   Status New     PT LONG  TERM GOAL #2   Title Patient will improve bilateral hip extension and abduction strength by at least 1/2 MMT grade to promote ability to perform standing tasks.    Time 4   Period Weeks   Status New               Plan - 08/06/16 1727    Clinical Impression Statement Continued working on lumbar and thoracic mobility in addition to trunk and hip strengthening to help decrease back pain.  No complain of back pain throughout session. Pt also demonstrates limited thoracic extension as well as R lateral shift posture in standing.    Rehab Potential Good   Clinical Impairments Affecting Rehab Potential (-) scoliosis; (+) motivation, decreased pain with medication   PT Frequency 2x / week  1-2x/week    PT Duration 4 weeks  or less   PT Treatment/Interventions Therapeutic exercise;Therapeutic activities;Manual techniques;Dry needling;Patient/family education;Neuromuscular re-education;Ultrasound;Traction;Moist Heat;Iontophoresis 4mg /ml Dexamethasone;Electrical Stimulation;Aquatic Therapy   PT Next Visit Plan core strengthening, thoracic extension, scapular strengthening, lumbopelvic control, hip strengthening   Consulted and Agree with Plan of Care Patient      Patient will benefit from skilled therapeutic intervention in order to improve the following deficits and impairments:  Pain, Improper body mechanics, Decreased strength  Visit Diagnosis: Bilateral low back pain without sciatica, unspecified chronicity     Problem List Patient Active Problem List   Diagnosis Date Noted  . Essential hypertension, benign 08/04/2016  . Aorto-iliac atherosclerosis ([place]) 06/06/2016  . 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 Scoliosis of lumbar spine 06/06/2016  . Medication monitoring encounter 06/06/2016  . Obesity 06/06/2016  . Other kyphosis of thoracic region 12/10/2015  . Preventative health care 12/10/2015  . Breast cancer screening 12/10/2015  . Need for  shingles vaccine 12/10/2015  . Screening for HIV (human immunodeficiency virus) 12/10/2015  . Need for hepatitis C screening test 12/10/2015   [name] PT, DPT   08/06/2016, 5:54 PM  [place] [facility] [address] [place], [address] Phone: [phone]   Fax:  [phone]  Name: Judith Olson MRN: [id number] Date of Birth: 1951-06-08

## 2016-08-08 ENCOUNTER — Ambulatory Visit: Payer: Commercial Managed Care - PPO

## 2016-08-08 DIAGNOSIS — M545 Low back pain, unspecified: Secondary | ICD-10-CM

## 2016-08-08 NOTE — Therapy (Signed)
[place] [facility] [address] [place], [address] Phone: [phone]   Fax:  [phone]  Physical Therapy Treatment  Patient Details  Name: Judith Olson MRN: [id number] Date of Birth: 12-09-1950 Referring Provider: Enid Derry, MD  Encounter Date: 08/08/2016      PT End of Session - 08/08/16 1723    Visit Number 7   Number of Visits 9   Date for PT Re-Evaluation 08/15/16   PT Start Time 1722   PT Stop Time 1753   PT Time Calculation (min) 31 min   Activity Tolerance Patient tolerated treatment well   Behavior During Therapy [facility] for tasks assessed/performed      Past Medical History:  Diagnosis Date  . Essential hypertension, benign 08/04/2016  . Hypertension   . Muscle spasms of both lower extremities   . Scoliosis of lumbar spine 06/06/2016    Past Surgical History:  Procedure Laterality Date  . ABDOMINAL HYSTERECTOMY  2000    There were no vitals filed for this visit.      Subjective Assessment - 08/08/16 1727    Subjective Back 0/10. Tries not to take pain medication if she does not have to. Has not had back pain for the past 7 days.    Pertinent History Back pain. Sudden onset. Pt states that her back has been bothering her for a while. Pain comes and goes. Pain started in her L side. Had pain patches which did not help. 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 The pain medication that her current doctor gave her helps. No leg pain. Denies bowel or bladder problems or LE paresthesia.  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  Pt also does crunches, leg lifts, tricep dips. Tries to exercise.  Pt states that her back pain is getting better since taking the new pain medication.    Patient Stated Goals Pt expresses desire to get better.   Currently in Pain? No/denies   Pain Score 0-No pain   Pain Onset More than a month ago                                 PT Education - 08/08/16 1729    Education provided Yes   Education Details ther-ex   [employer]) Educated Patient   Methods Explanation;Demonstration;Tactile cues;Verbal cues   Comprehension Verbalized understanding;Returned demonstration         Objectives   There-ex   Blood pressure L arm sitting: 145/79, HR 75, mechanically taken  Directed patient with standing bilateral shoulder extension with scapular retraction 10x3 resisting yellow band  Standing pallof press resisting yellow band 10x5 seconds for 2 sets for each side to promote trunk strengthening.   Supine hip fallout 10x2 each LE to promote lumbopelvic control.   Static mini lunge 10x each LE. Good femoral control.   T-band side stepping 32 ft to the R and 32 ft to the L to promote glute med strength 2x each direction. Good glute med muscle use felt.   T-band monster walks 20 ft resisting yellow band around ankles. Bilateral L5 dermatome sensation to feet but no pain. Disappeared with rest.   Standing squat to chair with yellow band around thighs resisting hip abduction/ER 10x2     Improved exercise technique,  movement at target joints, use of target muscles after mod verbal, visual, tactile cues.      Slight difficulty controlling contralateral LE during hip fallouts. No complain of back pain throughout session. Continued working on thoracic extension, trunk strengthening, hip strengthening, lumbopelvic control.             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    Baseline 8/10 at worst   Time 4   Period Weeks   Status New     PT LONG TERM GOAL #2   Title Patient will improve bilateral hip extension  and abduction strength by at least 1/2 MMT grade to promote ability to perform standing tasks.    Time 4   Period Weeks   Status New               Plan - 08/08/16 1726    Clinical Impression Statement Slight difficulty controlling contralateral LE during hip fallouts. No complain of back pain throughout session. Continued working on thoracic extension, trunk strengthening, hip strengthening, lumbopelvic control.    Rehab Potential Good   Clinical Impairments Affecting Rehab Potential (-) scoliosis; (+) motivation, decreased pain with medication   PT Frequency 2x / week  1-2x/week    PT Duration 4 weeks  or less   PT Treatment/Interventions Therapeutic exercise;Therapeutic activities;Manual techniques;Dry needling;Patient/family education;Neuromuscular re-education;Ultrasound;Traction;Moist Heat;Iontophoresis 4mg /ml Dexamethasone;Electrical Stimulation;Aquatic Therapy   PT Next Visit Plan core strengthening, thoracic extension, scapular strengthening, lumbopelvic control, hip strengthening   Consulted and Agree with Plan of Care Patient      Patient will benefit from skilled therapeutic intervention in order to improve the following deficits and impairments:  Pain, Improper body mechanics, Decreased strength  Visit Diagnosis: Bilateral low back pain without sciatica, unspecified chronicity     Problem List Patient Active Problem List   Diagnosis Date Noted  . Essential hypertension, benign 08/04/2016  . Aorto-iliac atherosclerosis ([place]) 06/06/2016  . 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 Scoliosis of lumbar spine 06/06/2016  . Medication monitoring encounter 06/06/2016  . Obesity 06/06/2016  . Other kyphosis of thoracic region 12/10/2015  . Preventative health care 12/10/2015  . Breast cancer screening 12/10/2015  . Need for shingles vaccine 12/10/2015  . Screening for HIV (human immunodeficiency virus) 12/10/2015  . Need for hepatitis C  screening test 12/10/2015    [name] PT, DPT   08/08/2016, 6:59 PM  [place] [facility] [address] [place], [address] Phone: [phone]   Fax:  [phone]  Name: Judith Olson MRN: [id number] Date of Birth: 01-11-1951

## 2016-08-12 ENCOUNTER — Ambulatory Visit: Payer: Commercial Managed Care - PPO

## 2016-08-12 DIAGNOSIS — M545 Low back pain, unspecified: Secondary | ICD-10-CM

## 2016-08-12 NOTE — Patient Instructions (Addendum)
  Band Walk: Side Stepping    Tie band around legs, just above ankles (not shown) Step ___30  feet to one side, then step back to start. Repeat for other side. Perform 2 sets  Repeat every other day.   Note: Small towel between band and skin eases rubbing.  http://plyo.exer.us/76   Copyright  VHI. All rights reserved.      Gave standing pallof press resisting yellow band 10x3 with 5 second holds daily as part of her HEP. Handout provided. Pt demonstrated and verbalized understanding.

## 2016-08-12 NOTE — Therapy (Signed)
[facility] [address] [place], [address] Phone: [phone]   Fax:  [phone]  Physical Therapy Treatment  Patient Details  Name: Judith Olson MRN: [id number] Date of Birth: [birth year] Referring Provider: Enid Derry, MD  Encounter Date: 08/12/2016      PT End of Session - 08/12/16 1716    Visit Number 8   Number of Visits 9   Date for PT Re-Evaluation 08/15/16   PT Start Time [id number]   PT Stop Time 1754   PT Time Calculation (min) 40 min   Activity Tolerance Patient tolerated treatment well   Behavior During Therapy [facility] for tasks assessed/performed      Past Medical History:  Diagnosis Date  . Essential hypertension, benign 08/04/2016  . Hypertension   . Muscle spasms of both lower extremities   . Scoliosis of lumbar spine 06/06/2016    Past Surgical History:  Procedure Laterality Date  . ABDOMINAL HYSTERECTOMY  2000    There were no vitals filed for this visit.      Subjective Assessment - 08/12/16 1718    Subjective Back is excellent. 0/10 Did not take her medication for the past 4 days. Has an appointment to follow up with her blood pressure tomorrow with Dr. Sanda Klein.    Pertinent History Back pain. Sudden onset. Pt states that her back has been bothering her for a while. Pain comes and goes. Pain started in her L side. Had pain patches which did not help. 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 The pain medication that her current doctor gave her helps. No leg pain. Denies bowel or bladder problems or LE paresthesia.  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  Pt also does crunches, leg lifts, tricep dips. Tries to exercise.  Pt states that her back pain is getting better since taking the new pain medication.    Patient Stated Goals Pt expresses desire to get better.   Currently in  Pain? No/denies   Pain Score 0-No pain   Pain Onset More than a month ago            [facility] PT Assessment - 08/12/16 1739      Strength   Right Hip Extension 4/5   Right Hip ABduction 4+/5   Left Hip Extension 4+/5   Left Hip ABduction 4+/5                             PT Education - 08/12/16 1723    Education provided Yes   Education Details ther-ex, HEP   Person(s) Educated Patient   Methods Explanation;Demonstration;Tactile cues;Verbal cues   Comprehension Returned demonstration;Verbalized understanding        Objectives  0/10 back pain at most for the past 7 days.   There-ex   Blood pressure obtained, L arm sitting: 142/79, HR 70, mechanically taken  Directed patient with T-band side stepping 32 ft to the R and 32 ft to the L to promote glute med strength 2x each direction.   Standing pallof press resisting yellow band 10x5 seconds for 2 sets for each side to promote trunk strengthening.   Reviewed and given as part of her HEP. Pt demonstrated and verbalized understanding.    Reviewed plan of care: potential graduation day from PT  next visit if back continues to do well. Pt verbalized understanding.   S/L hip abduction, prone glute max extension 1-2x each way for each LE  Bilateral scapular rows at total gym: height 6 for 10x3 to promote scapular strength and thoracic extension  Total gym squats height 19: single leg 10x3 each LE    Improved exercise technique, movement at target joints, use of target muscles after min to mod verbal, visual, tactile cues.     Pt demonstrates improved bilateral hip strength and 0/10 back pain. Pt making very good progress with PT towards goals. Continued working on thoracic extension, trunk and hip strengthening.             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    Baseline 8/10 at worst; 0/10 at worst for the past 7 days (08/12/2016)   Time 4   Period Weeks   Status Achieved     PT LONG TERM GOAL #2   Title Patient will improve bilateral hip extension and abduction strength by at least 1/2 MMT grade to promote ability to perform standing tasks.    Time 4   Period Weeks   Status Achieved               Plan - 08/12/16 1713    Clinical Impression Statement Pt demonstrates improved bilateral hip strength and 0/10 back pain. Pt making very good progress with PT towards goals. Continued working on thoracic extension, trunk and hip strengthening.    Rehab Potential Good   Clinical Impairments Affecting Rehab Potential (-) scoliosis; (+) motivation, decreased pain with medication   PT Frequency 2x / week  1-2x/week    PT Duration 4 weeks  or less   PT Treatment/Interventions Therapeutic exercise;Therapeutic activities;Manual techniques;Dry needling;Patient/family education;Neuromuscular re-education;Ultrasound;Traction;Moist Heat;Iontophoresis 4mg /ml Dexamethasone;Electrical Stimulation;Aquatic Therapy   PT Next Visit Plan core strengthening, thoracic extension, scapular strengthening, lumbopelvic control, hip strengthening   Consulted and Agree with Plan of Care Patient      Patient will benefit from skilled therapeutic intervention in order to improve the following deficits and impairments:  Pain, Improper body mechanics, Decreased strength  Visit Diagnosis: Bilateral low back pain without sciatica, unspecified chronicity     Problem List Patient Active Problem List   Diagnosis Date Noted  . Essential hypertension, benign 08/04/2016  . Aorto-iliac atherosclerosis ([place]) 06/06/2016  . 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 Scoliosis of lumbar spine 06/06/2016  . Medication monitoring encounter 06/06/2016  . Obesity 06/06/2016  .  Other kyphosis of thoracic region 12/10/2015  . Preventative health care 12/10/2015  . Breast cancer screening 12/10/2015  . Need for shingles vaccine 12/10/2015  . Screening for HIV (human immunodeficiency virus) 12/10/2015  . Need for hepatitis C screening test 12/10/2015    [name] PT, DPT   08/12/2016, 7:03 PM  [place] [facility] [address] [place], [address] Phone: [phone]   Fax:  [phone]  Name: Judith Olson MRN: [id number] Date of Birth: 1950-09-14

## 2016-08-13 ENCOUNTER — Ambulatory Visit (INDEPENDENT_AMBULATORY_CARE_PROVIDER_SITE_OTHER): Payer: Commercial Managed Care - PPO | Admitting: Family Medicine

## 2016-08-13 ENCOUNTER — Encounter: Payer: Self-pay | Admitting: Family Medicine

## 2016-08-13 VITALS — BP 118/74 | HR 94 | Temp 97.8°F | Resp 16 | Wt 165.3 lb

## 2016-08-13 DIAGNOSIS — E6609 Other obesity due to excess calories: Secondary | ICD-10-CM

## 2016-08-13 DIAGNOSIS — Z6830 Body mass index (BMI) 30.0-30.9, adult: Secondary | ICD-10-CM | POA: Diagnosis not present

## 2016-08-13 DIAGNOSIS — M47816 Spondylosis without myelopathy or radiculopathy, lumbar region: Secondary | ICD-10-CM

## 2016-08-13 DIAGNOSIS — I70299 Other atherosclerosis of native arteries of extremities, unspecified extremity: Secondary | ICD-10-CM

## 2016-08-13 DIAGNOSIS — Z5181 Encounter for therapeutic drug level monitoring: Secondary | ICD-10-CM

## 2016-08-13 DIAGNOSIS — I700 Atherosclerosis of aorta: Secondary | ICD-10-CM

## 2016-08-13 DIAGNOSIS — I708 Atherosclerosis of other arteries: Secondary | ICD-10-CM

## 2016-08-13 DIAGNOSIS — I10 Essential (primary) hypertension: Secondary | ICD-10-CM

## 2016-08-13 DIAGNOSIS — Z23 Encounter for immunization: Secondary | ICD-10-CM

## 2016-08-13 LAB — COMPLETE METABOLIC PANEL WITH GFR
ALT: 12 U/L (ref 6–29)
AST: 18 U/L (ref 10–35)
Albumin: 4.1 g/dL (ref 3.6–5.1)
Alkaline Phosphatase: 85 U/L (ref 33–130)
BILIRUBIN TOTAL: 0.6 mg/dL (ref 0.2–1.2)
BUN: 17 mg/dL (ref 7–25)
CO2: 28 mmol/L (ref 20–31)
CREATININE: 1.08 mg/dL — AB (ref 0.50–0.99)
Calcium: 9.8 mg/dL (ref 8.6–10.4)
Chloride: 105 mmol/L (ref 98–110)
GFR, EST AFRICAN AMERICAN: 63 mL/min (ref >=60)
GFR, Est Non African American: 55 mL/min — ABNORMAL LOW (ref >=60)
GLUCOSE: 74 mg/dL (ref 65–99)
Potassium: 3.9 mmol/L (ref 3.5–5.3)
Sodium: 142 mmol/L (ref 135–146)
TOTAL PROTEIN: 7.4 g/dL (ref 6.1–8.1)

## 2016-08-13 LAB — LIPID PANEL
Cholesterol: 143 mg/dL (ref <200)
HDL: 58 mg/dL (ref >50)
LDL Cholesterol: 70 mg/dL (ref <100)
TRIGLYCERIDES: 77 mg/dL (ref <150)
Total CHOL/HDL Ratio: 2.5 Ratio (ref <5.0)
VLDL: 15 mg/dL (ref <30)

## 2016-08-13 NOTE — Patient Instructions (Signed)
Request colonoscopy from [facility] (near [place]), done around 2012 or 2013 she thinks Keep up the amazing job you are doing with healthier eating and activity We'll contact you about your labs done today You received the vaccine to protect against tetanus and diphtheria and pertussis today; the tetanus and diphtheria portions will provide protection up to ten years, and the pertussis component will give you protection against whooping cough for life

## 2016-08-13 NOTE — Progress Notes (Signed)
BP 118/74   Pulse 94   Temp 97.8 F (36.6 C) (Oral)   Resp 16   Wt 165 lb 5 oz (75 kg)   SpO2 98%   BMI 30.24 kg/m    Subjective:    Patient ID: Judith Olson, female    DOB: [birth year], 66 y.o.   MRN: [id number]  HPI: Judith Olson is a 66 y.o. female  Chief Complaint  Patient presents with  . Hypertension   She is here for hypertension; we saw her two weeks ago; she was started on two agents and is here for recheck She does not use as much salt as before She has lost 6 pounds by exercising and eating less Usually fish and chicken; has pink salmon or chicken; smaller portions; eating a lot of vegetables; string beans and broccoli, little rice or sweet potato; not too much starch; cut down on bread intake; gets baked chicken or baked [place] on sandwiches; avoid bologna Sweet potato and mixed greens and one strip of ribs for lunch; had cholesterol drawn today  Seeing [place], [place]; he is working on everything; she tried out the total gym and will try to find one We reviewed her xray done in November 2017: CLINICAL DATA:  Back pain.  EXAM: LUMBAR SPINE - 2-3 VIEW  COMPARISON:  No recent prior.  FINDINGS: Scoliosis lumbar spine. Diffuse multilevel degenerative change. No acute abnormality identified. Sclerotic density noted over the right iliac wing, most likely bone island. Sclerotic densities noted over the abdomen most likely vascular. Aortoiliac atherosclerotic vascular disease noted.  IMPRESSION: 1. Lumbar spine scoliosis concave left with diffuse degenerative change. No acute bony abnormality.  2. 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  Electronically Signed   By: Marcello Moores  Register   On: 05/15/2016 15:45   Depression screen [facility] 2/9 08/13/2016 06/06/2016 11/14/2015  Decreased Interest 0 0 0  Down, Depressed, Hopeless 0 0 0  PHQ - 2 Score 0 0 0   Relevant past medical, surgical, family and social history  reviewed Past Medical History:  Diagnosis Date  . Aorto-iliac atherosclerosis ([place]) 06/06/2016   Discovered on ER films  . 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 Essential hypertension, benign 08/04/2016  . Hypertension   . Muscle spasms of both lower extremities   . Obesity 06/06/2016  . Scoliosis of lumbar spine 06/06/2016   Family History  Problem Relation Age of Onset  . Cancer Father     porstate  . Cancer Sister     breast  . Hypertension Neg Hx    Social History  Substance Use Topics  . Smoking status: Former Smoker    Types: Cigarettes  . Smokeless tobacco: Never Used  . Alcohol use 0.0 oz/week     Comment: not regularly   Interim medical history since last visit reviewed. Allergies and medications reviewed  Review of Systems Per HPI unless specifically indicated above     Objective:    BP 118/74   Pulse 94   Temp 97.8 F (36.6 C) (Oral)   Resp 16   Wt 165 lb 5 oz (75 kg)   SpO2 98%   BMI 30.24 kg/m   Wt Readings from Last 3 Encounters:  08/13/16 165 lb 5 oz (75 kg)  07/30/16 171 lb 12.8 oz (77.9 kg)  06/06/16 169 lb (76.7 kg)    Physical Exam  Constitutional: She appears well-developed and well-nourished.  Weight  loss more than six pounds over the last two weeks  HENT:  Mouth/Throat: Mucous membranes are normal.  Eyes: EOM are normal. No scleral icterus.  Cardiovascular: Normal rate and regular rhythm.   Pulmonary/Chest: Effort normal and breath sounds normal.  Musculoskeletal: She exhibits no edema.  Neurological: She is alert.  Psychiatric: She has a normal mood and affect. Her behavior is normal.      Assessment & Plan:   Problem List Items Addressed This Visit      Cardiovascular and Mediastinum   Essential hypertension, benign - Primary (Chronic)    Much better control today; so pleased with her determination, healthier habits; encouragement given; continue ACE-I      Aorto-iliac  atherosclerosis ([place]) (Chronic)    Work on weight loss, BP control; goal LDL less than 70; statin        Musculoskeletal and Integument   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)    Glad she is working with PT        Other   Obesity    Praise and encouragement given for her healthier eating, weight loss      Medication monitoring encounter    Other Visit Diagnoses    Need for diphtheria-tetanus-pertussis (Tdap) vaccine       Relevant Orders   Tdap vaccine greater than or equal to 7yo IM (Completed)       Follow up plan: Return in about 6 months (around 02/10/2017) for high blood pressure.  An after-visit summary was printed and given to the patient at [place].  Please see the patient instructions which may contain other information and recommendations beyond what is mentioned above in the assessment and plan.  Meds ordered this encounter  Medications  . latanoprost (XALATAN) 0.005 % ophthalmic solution    Sig: Place 1 drop into the right eye Nightly.     Refill:  5  . Omega-3 Fatty Acids (FISH OIL) 1000 MG CAPS    Sig: Take by mouth.  . cholecalciferol (VITAMIN D) 1000 units tablet    Sig: Take 1,000 Units by mouth daily.  . vitamin E 1000 UNIT capsule    Sig: Take 1,000 Units by mouth daily.    Orders Placed This Encounter  Procedures  . Tdap vaccine greater than or equal to 7yo IM

## 2016-08-14 ENCOUNTER — Other Ambulatory Visit: Payer: Self-pay

## 2016-08-14 ENCOUNTER — Ambulatory Visit: Payer: Commercial Managed Care - PPO

## 2016-08-14 DIAGNOSIS — N182 Chronic kidney disease, stage 2 (mild): Secondary | ICD-10-CM

## 2016-08-15 ENCOUNTER — Encounter: Payer: Self-pay | Admitting: Family Medicine

## 2016-08-15 ENCOUNTER — Ambulatory Visit: Payer: Commercial Managed Care - PPO | Attending: Family Medicine

## 2016-08-15 DIAGNOSIS — M545 Low back pain, unspecified: Secondary | ICD-10-CM

## 2016-08-15 NOTE — Patient Instructions (Signed)
  Gave supine hip fallouts (with transversus abdominis and pelvic floor contraction) 10x3 with 5 seconds for 3 sets daily for 5 days a week as part of her HEP. Pt demonstrated and verbalized understanding.

## 2016-08-15 NOTE — Therapy (Signed)
[facility] [address] [place], [address] Phone: [phone]   Fax:  [phone]  Physical Therapy Treatment And Discharge Summary   Patient Details  Name: Judith Olson MRN: [id number] Date of Birth: [birth year] Referring Provider: Enid Derry, MD  Encounter Date: 08/15/2016      PT End of Session - 08/15/16 1719    Visit Number 9   Number of Visits 9   Date for PT Re-Evaluation 08/15/16   PT Start Time 9476   PT Stop Time 1801   PT Time Calculation (min) 42 min   Activity Tolerance Patient tolerated treatment well   Behavior During Therapy [facility] for tasks assessed/performed      Past Medical History:  Diagnosis Date  . Essential hypertension, benign 08/04/2016  . Hypertension   . Muscle spasms of both lower extremities   . Scoliosis of lumbar spine 06/06/2016    Past Surgical History:  Procedure Laterality Date  . ABDOMINAL HYSTERECTOMY  2000    There were no vitals filed for this visit.      Subjective Assessment - 08/15/16 1723    Subjective Back is fine, no pain. Did not take pain medication (not even since last session). Has been doing her exercises. The exercises really helps her.  0/10 back pain at most for the past 7 days.  Feels like she can do her exercises at home (to contiue progress).    Pertinent History Back pain. Sudden onset. Pt states that her back has been bothering her for a while. Pain comes and goes. Pain started in her L side. Had pain patches which did not help. 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 The pain medication that her current doctor gave her helps. No leg pain. Denies bowel or bladder problems or LE paresthesia.  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  Pt also does crunches, leg lifts, tricep dips. Tries to exercise.  Pt states that her back pain is  getting better since taking the new pain medication.    Patient Stated Goals Pt expresses desire to get better.   Currently in Pain? No/denies   Pain Score 0-No pain   Pain Onset More than a month ago                                 PT Education - 08/15/16 1734    Education provided Yes   Education Details ther-ex, plan of care: continue progress with HEP.    Person(s) Educated Patient   Methods Explanation;Demonstration;Tactile cues;Verbal cues   Comprehension Returned demonstration;Verbalized understanding        Objectives   There-ex   Blood pressure obtained, L arm sitting: 135/79, HR 81, mechanically taken  Standing pallof press resisting yellow band 10x5 seconds for 2 sets for each side to promote trunk strengthening.   Standing bilateral shoulder extension resisting yellow band 10x5 seconds  Total gym squats height 19: single leg 10x3 each LE  Supine transversus abdominis contraction 5x5 seconds  Then with pelvic floor contractions 10x5 seconds  Then with hip fallouts 10x2 each LE to promote lumbopelvic control  Increased time spent to promote quality of movement   Improved exercise technique, movement at target joints, use of target muscles after mod verbal, visual, tactile  cues.     Pt has demonstrated improved bilateral hip extension and abduction strength and significant decrease in back pain with worst pain being 0/10 without using pain medication. Pt consistent and independent with her HEP. Pt has met all goals. Skilled physical therapy services discharged with patient continuing with her exercises at home.           PT Long Term Goals - 08/12/16 1902      PT LONG TERM GOAL #1   Title Patient will have a decrease in back pain to 3/10 or less at worst (without pain medication) to promote ability perform work tasks such as lifting parts for a transmission.    Baseline 8/10 at worst; 0/10 at worst for the past 7 days  (08/12/2016)   Time 4   Period Weeks   Status Achieved     PT LONG TERM GOAL #2   Title Patient will improve bilateral hip extension and abduction strength by at least 1/2 MMT grade to promote ability to perform standing tasks.    Time 4   Period Weeks   Status Achieved               Plan - 08/15/16 1734    Clinical Impression Statement Pt has demonstrated improved bilateral hip extension and abduction strength and significant decrease in back pain with worst pain being 0/10 without using pain medication. Pt consistent and independent with her HEP. Pt has met all goals. Skilled physical therapy services discharged with patient continuing with her exercises at home.    Rehab Potential Good   Clinical Impairments Affecting Rehab Potential (-) scoliosis; (+) motivation, decreased pain with medication   PT Frequency --   PT Duration --  or less   PT Treatment/Interventions Therapeutic exercise;Therapeutic activities;Patient/family education;Neuromuscular re-education   PT Next Visit Plan continue progress with home exercise program   Consulted and Agree with Plan of Care Patient      Patient will benefit from skilled therapeutic intervention in order to improve the following deficits and impairments:  Pain, Improper body mechanics, Decreased strength  Visit Diagnosis: Bilateral low back pain without sciatica, unspecified chronicity     Problem List Patient Active Problem List   Diagnosis Date Noted  . Essential hypertension, benign 08/04/2016  . Aorto-iliac atherosclerosis ([place]) 06/06/2016  . 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 Scoliosis of lumbar spine 06/06/2016  . Medication monitoring encounter 06/06/2016  . Obesity 06/06/2016  . Other kyphosis of thoracic region 12/10/2015  . Preventative health care 12/10/2015  . Breast cancer screening 12/10/2015  . Need for shingles vaccine 12/10/2015  . Screening for HIV (human immunodeficiency  virus) 12/10/2015  . Need for hepatitis C screening test 12/10/2015    [name] PT, DPT   08/15/2016, 6:28 PM  [facility] [address] [place], [address] Phone: [phone]   Fax:  [phone]  Name: Celine Dishman MRN: [id number] Date of Birth: 08-Oct-1950

## 2016-08-16 ENCOUNTER — Other Ambulatory Visit: Payer: Self-pay | Admitting: Family Medicine

## 2016-08-16 NOTE — Telephone Encounter (Signed)
Last labs reviewed; Rx statin approved

## 2016-08-19 ENCOUNTER — Encounter: Payer: Self-pay | Admitting: Family Medicine

## 2016-08-19 NOTE — Assessment & Plan Note (Signed)
Work on weight loss, BP control; goal LDL less than 70; statin

## 2016-08-19 NOTE — Assessment & Plan Note (Signed)
Glad she is working with PT

## 2016-08-19 NOTE — Assessment & Plan Note (Signed)
Praise and encouragement given for her healthier eating, weight loss

## 2016-08-19 NOTE — Assessment & Plan Note (Signed)
Much better control today; so pleased with her determination, healthier habits; encouragement given; continue ACE-I

## 2016-08-27 ENCOUNTER — Other Ambulatory Visit: Payer: Self-pay | Admitting: Family Medicine

## 2016-08-27 NOTE — Telephone Encounter (Signed)
Due for recheck BMP in May

## 2016-10-31 ENCOUNTER — Other Ambulatory Visit: Payer: Self-pay

## 2016-10-31 MED ORDER — CHLORTHALIDONE 25 MG PO TABS: 25.0000 mg | ORAL_TABLET | Freq: Every day | ORAL | 0 refills | Status: DC

## 2016-10-31 NOTE — Telephone Encounter (Signed)
Left detailed voicemial 

## 2016-10-31 NOTE — Telephone Encounter (Signed)
Please give patient a gentle reminder about her lab orders; thank you I'll send refill

## 2016-10-31 NOTE — Telephone Encounter (Signed)
Ins requesting 90 day supply 

## 2016-11-13 ENCOUNTER — Telehealth: Payer: Self-pay | Admitting: Family Medicine

## 2016-11-13 ENCOUNTER — Other Ambulatory Visit: Payer: Self-pay

## 2016-11-13 MED ORDER — LISINOPRIL 20 MG PO TABS: 20.0000 mg | ORAL_TABLET | Freq: Every day | ORAL | 0 refills | Status: DC

## 2016-11-13 NOTE — Telephone Encounter (Signed)
Requesting 90 day supply.

## 2016-11-13 NOTE — Telephone Encounter (Signed)
I left detailed message for patient; received note from pharmacy benefit manager Small but possible cardiac risk associated with meloxicam Stop it for now and we'll talk on Friday about options for dealing with pain

## 2016-11-13 NOTE — Telephone Encounter (Signed)
Please remind patient that she is due for a BMP now; I'll send refill but hope we'll see her this week for her labs; she does not need to fast Thank you

## 2016-11-13 NOTE — Telephone Encounter (Signed)
Notice received from [facility] Con[facility] about her taking meloxicam I called, but she is not home from work yet

## 2016-11-13 NOTE — Telephone Encounter (Signed)
Left detailed voicemail

## 2016-11-15 ENCOUNTER — Encounter: Payer: Self-pay | Admitting: Family Medicine

## 2016-11-15 ENCOUNTER — Ambulatory Visit (INDEPENDENT_AMBULATORY_CARE_PROVIDER_SITE_OTHER): Payer: Commercial Managed Care - PPO | Admitting: Family Medicine

## 2016-11-15 VITALS — BP 122/76 | HR 70 | Temp 98.1°F | Resp 14 | Ht 61.6 in | Wt 182.0 lb

## 2016-11-15 DIAGNOSIS — Z0000 Encounter for general adult medical examination without abnormal findings: Secondary | ICD-10-CM

## 2016-11-15 DIAGNOSIS — Z1231 Encounter for screening mammogram for malignant neoplasm of breast: Secondary | ICD-10-CM | POA: Diagnosis not present

## 2016-11-15 DIAGNOSIS — M47816 Spondylosis without myelopathy or radiculopathy, lumbar region: Secondary | ICD-10-CM

## 2016-11-15 DIAGNOSIS — Z1239 Encounter for other screening for malignant neoplasm of breast: Secondary | ICD-10-CM

## 2016-11-15 DIAGNOSIS — E6609 Other obesity due to excess calories: Secondary | ICD-10-CM | POA: Diagnosis not present

## 2016-11-15 DIAGNOSIS — Z6833 Body mass index (BMI) 33.0-33.9, adult: Secondary | ICD-10-CM

## 2016-11-15 NOTE — Assessment & Plan Note (Signed)
Encouragement given to lose weight; see AVS

## 2016-11-15 NOTE — Assessment & Plan Note (Signed)
USPSTF grade A and B recommendations reviewed with patient; age-appropriate recommendations, preventive care, screening tests, etc discussed and encouraged; healthy living encouraged; see AVS for patient education given to patient  

## 2016-11-15 NOTE — Assessment & Plan Note (Signed)
Avoid NSAID; suggested plain tylenol, tumeric

## 2016-11-15 NOTE — Progress Notes (Signed)
Patient ID: Judith Olson, female   DOB: Sep 19, 1950, 66 y.o.   MRN: [id number]   Subjective:   Judith Olson is a 66 y.o. female here for a complete physical exam  Interim issues since last visit: stopped the meloxicam; has some arthritis  USPSTF grade A and B recommendations Depression:  Depression screen [facility] 2/9 11/15/2016 08/13/2016 06/06/2016 11/14/2015  Decreased Interest 0 0 0 0  Down, Depressed, Hopeless 0 0 0 0  PHQ - 2 Score 0 0 0 0   Hypertension: BP Readings from Last 3 Encounters:  11/15/16 122/76  08/13/16 118/74  07/30/16 (!) 162/94   Obesity: her son has been cooking for her, she is going to work on this now [employer] from Last 3 Encounters:  11/15/16 182 lb (82.6 kg)  08/13/16 165 lb 5 oz (75 kg)  07/30/16 171 lb 12.8 oz (77.9 kg)   BMI Readings from Last 3 Encounters:  11/15/16 33.72 kg/m  08/13/16 30.24 kg/m  07/30/16 31.42 kg/m    Alcohol: every now and then Tobacco use: remote HIV, hep B, hep C: UTD STD testing and prevention (chl/gon/syphilis): not detected Intimate partner violence: no abuse Breast cancer: ordered mammogram today; no lumps BRCA gene screening: sister died from breast cancer, she quit her treatments; no other family members Cervical cancer screening: s/p hyst for fibroids Osteoporosis:  Fall prevention/vitamin D: going to start back on vitamin D Lipids: on statin and going to start krill oil Lab Results  Component Value Date   CHOL 143 08/13/2016   CHOL 175 11/20/2015   Lab Results  Component Value Date   HDL 58 08/13/2016   HDL 48 11/20/2015   Lab Results  Component Value Date   LDLCALC 70 08/13/2016   LDLCALC 109 (H) 11/20/2015   Lab Results  Component Value Date   TRIG 77 08/13/2016   TRIG 89 11/20/2015   Lab Results  Component Value Date   CHOLHDL 2.5 08/13/2016   No results found for: LDLDIRECT Glucose:  Glucose  Date Value Ref Range Status  11/20/2015 95 65 - 99 mg/dL Final   Glucose, Bld  Date  Value Ref Range Status  08/13/2016 74 65 - 99 mg/dL Final   Colorectal cancer: 2014; next 2024 per pt Lung cancer:  Remote smoker AAA: n/a Aspirin: taking baby aspirin Diet: going to work on this again; gained weight and going to try to get back down Exercise: going to start walking through the week Skin cancer: no worrisome moles   Past Medical History:  Diagnosis Date  . Aorto-iliac atherosclerosis ([place]) 06/06/2016   Discovered on ER films  . 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 Essential hypertension, benign 08/04/2016  . Hypertension   . Muscle spasms of both lower extremities   . Obesity 06/06/2016  . Scoliosis of lumbar spine 06/06/2016   Past Surgical History:  Procedure Laterality Date  . ABDOMINAL HYSTERECTOMY  2000   Family History  Problem Relation Age of Onset  . Cancer Father        porstate  . Cancer Sister        breast  . Cancer Brother        unknown  . Hypertension Neg Hx   MD note: no colon cancer  Social History  Substance Use Topics  . Smoking status: Former Smoker    Types: Cigarettes  . Smokeless tobacco: Never Used  . Alcohol use 0.0 oz/week  Comment: not regularly   Review of Systems  Constitutional: Negative for unexpected weight change.  HENT: Negative for hearing loss.   Eyes: Negative for visual disturbance.  Cardiovascular: Negative for chest pain.  Gastrointestinal: Negative for blood in stool.  Genitourinary: Negative for hematuria.    Objective:   Vitals:   11/15/16 1351  BP: 122/76  Pulse: 70  Resp: 14  Temp: 98.1 F (36.7 C)  TempSrc: Oral  SpO2: 97%  Weight: 182 lb (82.6 kg)  Height: 5' 1.6" (1.565 m)   Body mass index is 33.72 kg/m. Wt Readings from Last 3 Encounters:  11/15/16 182 lb (82.6 kg)  08/13/16 165 lb 5 oz (75 kg)  07/30/16 171 lb 12.8 oz (77.9 kg)   Physical Exam  Constitutional: She appears well-developed and well-nourished. No  distress.  Obese, weight gain noted  HENT:  Head: Normocephalic and atraumatic.  Right Ear: Hearing, tympanic membrane, external ear and ear canal normal.  Left Ear: Hearing, tympanic membrane, external ear and ear canal normal.  Nose: No rhinorrhea.  Mouth/Throat: Oropharynx is clear and moist and mucous membranes are normal.  Eyes: Conjunctivae and EOM are normal. Right eye exhibits no hordeolum. Left eye exhibits no hordeolum. No scleral icterus.  Neck: Carotid bruit is not present. No thyromegaly present.  Cardiovascular: Normal rate, regular rhythm, S1 normal, S2 normal and normal heart sounds.   No extrasystoles are present.  Pulmonary/Chest: Effort normal and breath sounds normal. No respiratory distress. Right breast exhibits no inverted nipple, no mass, no nipple discharge, no skin change and no tenderness. Left breast exhibits no inverted nipple, no mass, no nipple discharge, no skin change and no tenderness. Breasts are symmetrical.  Abdominal: Soft. Normal appearance and bowel sounds are normal. She exhibits no distension, no abdominal bruit, no pulsatile midline mass and no mass. There is no hepatosplenomegaly. There is no tenderness. No hernia.  Musculoskeletal: Normal range of motion. She exhibits no edema.  Lymphadenopathy:       Head (right side): No submandibular adenopathy present.       Head (left side): No submandibular adenopathy present.    She has no cervical adenopathy.    She has no axillary adenopathy.  Neurological: She is alert. She displays no tremor. No cranial nerve deficit. She exhibits normal muscle tone. Gait normal.  Reflex Scores:      Patellar reflexes are 1+ on the right side and 1+ on the left side. Skin: Skin is warm and dry. No bruising and no ecchymosis noted. No cyanosis. No pallor.  Psychiatric: Her speech is normal and behavior is normal. Thought content normal. Her mood appears not anxious. She does not exhibit a depressed mood.     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)    Avoid NSAID; suggested plain tylenol, tumeric        Other   Preventative health care - Primary    USPSTF grade A and B recommendations reviewed with patient; age-appropriate recommendations, preventive care, screening tests, etc discussed and encouraged; healthy living encouraged; see AVS for patient education given to patient      Relevant Orders   CBC with Differential/Platelet   COMPLETE METABOLIC PANEL WITH GFR   Lipid panel   TSH   Obesity    Encouragement given to lose weight; see AVS      Breast cancer screening    Yearly  mammogram; CBE done today       Other Visit Diagnoses    Screening for breast cancer       Relevant Orders   MM Digital Screening       Meds ordered this encounter  Medications  . DISCONTD: meloxicam (MOBIC) 15 MG tablet    Sig: Take 15 mg by mouth daily.    Refill:  5   Orders Placed This Encounter  Procedures  . MM Digital Screening    Standing Status:   Future    Standing Expiration Date:   01/15/2018    Order Specific Question:   Reason for Exam (SYMPTOM  OR DIAGNOSIS REQUIRED)    Answer:   screen for breast cancer    Order Specific Question:   Preferred imaging location?    Answer:    [facility]  . CBC with Differential/Platelet  . COMPLETE METABOLIC PANEL WITH GFR  . Lipid panel  . TSH    Follow up plan: Return in about 1 year (around 11/15/2017) for complete physical.  An After Visit Summary was printed and given to the patient.

## 2016-11-15 NOTE — Patient Instructions (Addendum)
If you need something for aches or pains, try to use Tylenol (acetaminophen) instead of non-steroidals (which include Aleve, ibuprofen, Advil, Motrin, and naproxen); non-steroidals can cause long-term kidney damage Always follow the package direction Try turmeric as a natural anti-inflammatory (for pain and arthritis). It comes in capsules where you buy aspirin and fish oil, but also as a spice where you buy pepper and garlic powder. Check out the information at familydoctor.org entitled "Nutrition for Weight Loss: What You Need to Know about Fad Diets" Try to lose between 1-2 pounds per week by taking in fewer calories and burning off more calories You can succeed by limiting portions, limiting foods dense in calories and fat, becoming more active, and drinking 8 glasses of water a day (64 ounces) Don't skip meals, especially breakfast, as skipping meals may alter your metabolism Do not use over-the-counter weight loss pills or gimmicks that claim rapid weight loss A healthy BMI (or body mass index) is between 18.5 and 24.9 You can calculate your ideal BMI at the [place] website ClubMonetize.fr  Health Maintenance, Female Adopting a healthy lifestyle and getting preventive care can go a long way to promote health and wellness. Talk with your health care provider about what schedule of regular examinations is right for you. This is a good chance for you to check in with your provider about disease prevention and staying healthy. In between checkups, there are plenty of things you can do on your own. Experts have done a lot of research about which lifestyle changes and preventive measures are most likely to keep you healthy. Ask your health care provider for more information. Weight and diet Eat a healthy diet  Be sure to include plenty of vegetables, fruits, low-fat dairy products, and lean protein.  Do not eat a lot of foods high in solid fats,  added sugars, or salt.  Get regular exercise. This is one of the most important things you can do for your health. ? Most adults should exercise for at least 150 minutes each week. The exercise should increase your heart rate and make you sweat (moderate-intensity exercise). ? Most adults should also do strengthening exercises at least twice a week. This is in addition to the moderate-intensity exercise.  Maintain a healthy weight  Body mass index (BMI) is a measurement that can be used to identify possible weight problems. It estimates body fat based on height and weight. Your health care provider can help determine your BMI and help you achieve or maintain a healthy weight.  For females 7 years of age and older: ? A BMI below 18.5 is considered underweight. ? A BMI of 18.5 to 24.9 is normal. ? A BMI of 25 to 29.9 is considered overweight. ? A BMI of 30 and above is considered obese.  Watch levels of cholesterol and blood lipids  You should start having your blood tested for lipids and cholesterol at 64 years of age, then have this test every 5 years.  You may need to have your cholesterol levels checked more often if: ? Your lipid or cholesterol levels are high. ? You are older than 64 years of age. ? You are at high risk for heart disease.  Cancer screening Lung Cancer  Lung cancer screening is recommended for adults 30-46 years old who are at high risk for lung cancer because of a history of smoking.  A yearly low-dose CT scan of the lungs is recommended for people who: ? Currently smoke. ? Have quit within  the past 15 years. ? Have at least a 30-pack-year history of smoking. A pack year is smoking an average of one pack of cigarettes a day for 1 year.  Yearly screening should continue until it has been 15 years since you quit.  Yearly screening should stop if you develop a health problem that would prevent you from having lung cancer treatment.  Breast Cancer  Practice  breast self-awareness. This means understanding how your breasts normally appear and feel.  It also means doing regular breast self-exams. Let your health care provider know about any changes, no matter how small.  If you are in your 20s or 30s, you should have a clinical breast exam (CBE) by a health care provider every 1-3 years as part of a regular health exam.  If you are 3 or older, have a CBE every year. Also consider having a breast X-ray (mammogram) every year.  If you have a family history of breast cancer, talk to your health care provider about genetic screening.  If you are at high risk for breast cancer, talk to your health care provider about having an MRI and a mammogram every year.  Breast cancer gene (BRCA) assessment is recommended for women who have family members with BRCA-related cancers. BRCA-related cancers include: ? Breast. ? Ovarian. ? Tubal. ? Peritoneal cancers.  Results of the assessment will determine the need for genetic counseling and BRCA1 and BRCA2 testing.  Cervical Cancer Your health care provider may recommend that you be screened regularly for cancer of the pelvic organs (ovaries, uterus, and vagina). This screening involves a pelvic examination, including checking for microscopic changes to the surface of your cervix (Pap test). You may be encouraged to have this screening done every 3 years, beginning at age 76.  For women ages 61-65, health care providers may recommend pelvic exams and Pap testing every 3 years, or they may recommend the Pap and pelvic exam, combined with testing for human papilloma virus (HPV), every 5 years. Some types of HPV increase your risk of cervical cancer. Testing for HPV may also be done on women of any age with unclear Pap test results.  Other health care providers may not recommend any screening for nonpregnant women who are considered low risk for pelvic cancer and who do not have symptoms. Ask your health care provider  if a screening pelvic exam is right for you.  If you have had past treatment for cervical cancer or a condition that could lead to cancer, you need Pap tests and screening for cancer for at least 20 years after your treatment. If Pap tests have been discontinued, your risk factors (such as having a new sexual partner) need to be reassessed to determine if screening should resume. Some women have medical problems that increase the chance of getting cervical cancer. In these cases, your health care provider may recommend more frequent screening and Pap tests.  Colorectal Cancer  This type of cancer can be detected and often prevented.  Routine colorectal cancer screening usually begins at 64 years of age and continues through 64 years of age.  Your health care provider may recommend screening at an earlier age if you have risk factors for colon cancer.  Your health care provider may also recommend using home test kits to check for hidden blood in the stool.  A small camera at the end of a tube can be used to examine your colon directly (sigmoidoscopy or colonoscopy). This is done to check for  the earliest forms of colorectal cancer.  Routine screening usually begins at age 34.  Direct examination of the colon should be repeated every 5-10 years through 64 years of age. However, you may need to be screened more often if early forms of precancerous polyps or small growths are found.  Skin Cancer  Check your skin from head to toe regularly.  Tell your health care provider about any new moles or changes in moles, especially if there is a change in a mole's shape or color.  Also tell your health care provider if you have a mole that is larger than the size of a pencil eraser.  Always use sunscreen. Apply sunscreen liberally and repeatedly throughout the day.  Protect yourself by wearing long sleeves, pants, a wide-brimmed hat, and sunglasses whenever you are outside.  Heart disease,  diabetes, and high blood pressure  High blood pressure causes heart disease and increases the risk of stroke. High blood pressure is more likely to develop in: ? People who have blood pressure in the high end of the normal range (130-139/85-89 mm Hg). ? People who are overweight or obese. ? People who are African American.  If you are 71-41 years of age, have your blood pressure checked every 3-5 years. If you are 17 years of age or older, have your blood pressure checked every year. You should have your blood pressure measured twice-once when you are at a hospital or clinic, and once when you are not at a hospital or clinic. Record the average of the two measurements. To check your blood pressure when you are not at a hospital or clinic, you can use: ? An automated blood pressure machine at a pharmacy. ? A home blood pressure monitor.  If you are between 19 years and 63 years old, ask your health care provider if you should take aspirin to prevent strokes.  Have regular diabetes screenings. This involves taking a blood sample to check your fasting blood sugar level. ? If you are at a normal weight and have a low risk for diabetes, have this test once every three years after 64 years of age. ? If you are overweight and have a high risk for diabetes, consider being tested at a younger age or more often. Preventing infection Hepatitis B  If you have a higher risk for hepatitis B, you should be screened for this virus. You are considered at high risk for hepatitis B if: ? You were born in a country where hepatitis B is common. Ask your health care provider which countries are considered high risk. ? Your parents were born in a high-risk country, and you have not been immunized against hepatitis B (hepatitis B vaccine). ? You have HIV or AIDS. ? You use needles to inject street drugs. ? You live with someone who has hepatitis B. ? You have had sex with someone who has hepatitis B. ? You get  hemodialysis treatment. ? You take certain medicines for conditions, including cancer, organ transplantation, and autoimmune conditions.  Hepatitis C  Blood testing is recommended for: ? Everyone born from [birth year] through 1963. ? Anyone with known risk factors for hepatitis C.  Sexually transmitted infections (STIs)  You should be screened for sexually transmitted infections (STIs) including gonorrhea and chlamydia if: ? You are sexually active and are younger than 64 years of age. ? You are older than 64 years of age and your health care provider tells you that you are at risk for this  type of infection. ? Your sexual activity has changed since you were last screened and you are at an increased risk for chlamydia or gonorrhea. Ask your health care provider if you are at risk.  If you do not have HIV, but are at risk, it may be recommended that you take a prescription medicine daily to prevent HIV infection. This is called pre-exposure prophylaxis (PrEP). You are considered at risk if: ? You are sexually active and do not regularly use condoms or know the HIV status of your partner(s). ? You take drugs by injection. ? You are sexually active with a partner who has HIV.  Talk with your health care provider about whether you are at high risk of being infected with HIV. If you choose to begin PrEP, you should first be tested for HIV. You should then be tested every 3 months for as long as you are taking PrEP. Pregnancy  If you are premenopausal and you may become pregnant, ask your health care provider about preconception counseling.  If you may become pregnant, take 400 to 800 micrograms (mcg) of folic acid every day.  If you want to prevent pregnancy, talk to your health care provider about birth control (contraception). Osteoporosis and menopause  Osteoporosis is a disease in which the bones lose minerals and strength with aging. This can result in serious bone fractures. Your risk for  osteoporosis can be identified using a bone density scan.  If you are 12 years of age or older, or if you are at risk for osteoporosis and fractures, ask your health care provider if you should be screened.  Ask your health care provider whether you should take a calcium or vitamin D supplement to lower your risk for osteoporosis.  Menopause may have certain physical symptoms and risks.  Hormone replacement therapy may reduce some of these symptoms and risks. Talk to your health care provider about whether hormone replacement therapy is right for you. Follow these instructions at home:  Schedule regular health, dental, and eye exams.  Stay current with your immunizations.  Do not use any tobacco products including cigarettes, chewing tobacco, or electronic cigarettes.  If you are pregnant, do not drink alcohol.  If you are breastfeeding, limit how much and how often you drink alcohol.  Limit alcohol intake to no more than 1 drink per day for nonpregnant women. One drink equals 12 ounces of beer, 5 ounces of wine, or 1 ounces of hard liquor.  Do not use street drugs.  Do not share needles.  Ask your health care provider for help if you need support or information about quitting drugs.  Tell your health care provider if you often feel depressed.  Tell your health care provider if you have ever been abused or do not feel safe at home. This information is not intended to replace advice given to you by your health care provider. Make sure you discuss any questions you have with your health care provider. Document Released: 12/17/2010 Document Revised: 11/09/2015 Document Reviewed: 03/07/2015 Elsevier Interactive Patient Education  [employer].

## 2016-11-15 NOTE — Assessment & Plan Note (Signed)
Yearly mammogram; CBE done today

## 2016-11-16 LAB — LIPID PANEL
CHOL/HDL RATIO: 2.4 ratio (ref <5.0)
Cholesterol: 144 mg/dL (ref <200)
HDL: 60 mg/dL (ref >50)
LDL Cholesterol: 49 mg/dL (ref <100)
Triglycerides: 173 mg/dL — ABNORMAL HIGH (ref <150)
VLDL: 35 mg/dL — ABNORMAL HIGH (ref <30)

## 2016-11-16 LAB — CBC WITH DIFFERENTIAL/PLATELET
Basophils Absolute: 0 cells/uL (ref 0–200)
Basophils Relative: 0 %
EOS PCT: 3 %
Eosinophils Absolute: 159 cells/uL (ref 15–500)
HCT: 39 % (ref 35.0–45.0)
Hemoglobin: 12.1 g/dL (ref 11.7–15.5)
LYMPHS PCT: 45 %
Lymphs Abs: 2385 cells/uL (ref 850–3900)
MCH: 28.2 pg (ref 27.0–33.0)
MCHC: 31 g/dL — AB (ref 32.0–36.0)
MCV: 90.9 fL (ref 80.0–100.0)
MONOS PCT: 7 %
MPV: 10.6 fL (ref 7.5–12.5)
Monocytes Absolute: 371 cells/uL (ref 200–950)
NEUTROS PCT: 45 %
Neutro Abs: 2385 cells/uL (ref 1500–7800)
PLATELETS: 273 10*3/uL (ref 140–400)
RBC: 4.29 MIL/uL (ref 3.80–5.10)
RDW: 13.9 % (ref 11.0–15.0)
WBC: 5.3 10*3/uL (ref 3.8–10.8)

## 2016-11-16 LAB — COMPLETE METABOLIC PANEL WITH GFR
ALBUMIN: 4 g/dL (ref 3.6–5.1)
ALK PHOS: 72 U/L (ref 33–130)
ALT: 11 U/L (ref 6–29)
AST: 16 U/L (ref 10–35)
BUN: 23 mg/dL (ref 7–25)
CO2: 28 mmol/L (ref 20–31)
Calcium: 9.7 mg/dL (ref 8.6–10.4)
Chloride: 105 mmol/L (ref 98–110)
Creat: 0.92 mg/dL (ref 0.50–0.99)
GFR, EST NON AFRICAN AMERICAN: 66 mL/min (ref >=60)
GFR, Est African American: 77 mL/min (ref >=60)
GLUCOSE: 100 mg/dL — AB (ref 65–99)
POTASSIUM: 3.8 mmol/L (ref 3.5–5.3)
SODIUM: 142 mmol/L (ref 135–146)
Total Bilirubin: 0.6 mg/dL (ref 0.2–1.2)
Total Protein: 7 g/dL (ref 6.1–8.1)

## 2016-11-16 LAB — TSH: TSH: 0.31 m[IU]/L — AB

## 2016-11-18 ENCOUNTER — Other Ambulatory Visit: Payer: Self-pay

## 2016-11-18 DIAGNOSIS — R7989 Other specified abnormal findings of blood chemistry: Secondary | ICD-10-CM

## 2016-11-29 ENCOUNTER — Telehealth: Payer: Self-pay | Admitting: Family Medicine

## 2016-11-29 DIAGNOSIS — R928 Other abnormal and inconclusive findings on diagnostic imaging of breast: Secondary | ICD-10-CM

## 2016-11-29 NOTE — Telephone Encounter (Signed)
PT SAID THAT [place] ASK THE DR TO GET AN ORDER FOR HER TO HAVE A MAMMOGRAM SINCE SHE HAS HAD ISSUE IN THE PAST. CAN BE ANY DAY AS LATE IN THE EVENING FOR SHE DOES NOT GET OFF WORK TILL 3:30 AND WOULD TAKE HER 15 TO 20 MIN TO GET THERE.

## 2016-11-29 NOTE — Telephone Encounter (Signed)
I dug deeper since it's Friday after 5 pm; her last imaging was abnormal; she likely needs diag mammo plus [place]; ordering

## 2016-11-29 NOTE — Assessment & Plan Note (Signed)
Ordering mammogram as well as RIGHT breast US if needed

## 2016-11-29 NOTE — Telephone Encounter (Signed)
There is already an order in the system for a mammogram, ordered 11/15/2016; thank you

## 2016-12-02 ENCOUNTER — Other Ambulatory Visit: Payer: Self-pay

## 2016-12-02 DIAGNOSIS — R928 Other abnormal and inconclusive findings on diagnostic imaging of breast: Secondary | ICD-10-CM

## 2016-12-02 NOTE — Telephone Encounter (Signed)
Ok scheduled on July 19 @3 :20 patient notified, but I had to change the u/s so please sign off

## 2017-01-02 ENCOUNTER — Ambulatory Visit
Admission: RE | Admit: 2017-01-02 | Discharge: 2017-01-02 | Disposition: A | Discharge status: Home / Self Care | Payer: Commercial Managed Care - PPO | Source: Ambulatory Visit | Attending: Family Medicine | Admitting: Family Medicine

## 2017-01-02 DIAGNOSIS — R928 Other abnormal and inconclusive findings on diagnostic imaging of breast: Secondary | ICD-10-CM

## 2017-02-10 ENCOUNTER — Encounter: Payer: Self-pay | Admitting: Family Medicine

## 2017-02-10 ENCOUNTER — Ambulatory Visit (INDEPENDENT_AMBULATORY_CARE_PROVIDER_SITE_OTHER): Payer: Commercial Managed Care - PPO | Admitting: Family Medicine

## 2017-02-10 DIAGNOSIS — R946 Abnormal results of thyroid function studies: Secondary | ICD-10-CM

## 2017-02-10 DIAGNOSIS — I708 Atherosclerosis of other arteries: Secondary | ICD-10-CM

## 2017-02-10 DIAGNOSIS — I70299 Other atherosclerosis of native arteries of extremities, unspecified extremity: Secondary | ICD-10-CM | POA: Diagnosis not present

## 2017-02-10 DIAGNOSIS — E6609 Other obesity due to excess calories: Secondary | ICD-10-CM | POA: Diagnosis not present

## 2017-02-10 DIAGNOSIS — I10 Essential (primary) hypertension: Secondary | ICD-10-CM | POA: Diagnosis not present

## 2017-02-10 DIAGNOSIS — I700 Atherosclerosis of aorta: Secondary | ICD-10-CM | POA: Diagnosis not present

## 2017-02-10 DIAGNOSIS — M419 Scoliosis, unspecified: Secondary | ICD-10-CM | POA: Diagnosis not present

## 2017-02-10 DIAGNOSIS — R7989 Other specified abnormal findings of blood chemistry: Secondary | ICD-10-CM

## 2017-02-10 DIAGNOSIS — Z6833 Body mass index (BMI) 33.0-33.9, adult: Secondary | ICD-10-CM | POA: Diagnosis not present

## 2017-02-10 HISTORY — DX: Abnormal results of thyroid function studies: R94.6

## 2017-02-10 NOTE — Assessment & Plan Note (Signed)
Continue statin, LDL is at goal; continue healthy eating habits

## 2017-02-10 NOTE — Patient Instructions (Addendum)
We'll get labs today Check out the information at familydoctor.org entitled "Nutrition for Weight Loss: What You Need to Know about Fad Diets" Try to lose between 1-2 pounds per week by taking in fewer calories and burning off more calories You can succeed by limiting portions, limiting foods dense in calories and fat, becoming more active, and drinking 8 glasses of water a day (64 ounces) Don't skip meals, especially breakfast, as skipping meals may alter your metabolism Do not use over-the-counter weight loss pills or gimmicks that claim rapid weight loss A healthy BMI (or body mass index) is between 18.5 and 24.9 You can calculate your ideal BMI at the [employer] website ClubMonetize.fr Try to limit saturated fats in your diet (bologna, hot dogs, barbeque, cheeseburgers, hamburgers, steak, bacon, sausage, cheese, etc.) and get more fresh fruits, vegetables, and whole grains  Cholesterol Cholesterol is a fat. Your body needs a small amount of cholesterol. Cholesterol (plaque) may build up in your blood vessels (arteries). That makes you more likely to have a heart attack or stroke. You cannot feel your cholesterol level. Having a blood test is the only way to find out if your level is high. Keep your test results. Work with your doctor to keep your cholesterol at a good level. What do the results mean?  Total cholesterol is how much cholesterol is in your blood.  LDL is bad cholesterol. This is the type that can build up. Try to have low LDL.  HDL is good cholesterol. It cleans your blood vessels and carries LDL away. Try to have high HDL.  Triglycerides are fat that the body can store or burn for energy. What are good levels of cholesterol?  Total cholesterol below 200.  LDL below 100 is good for people who have health risks. LDL below 70 is good for people who have very high risks.  HDL above 40 is good. It is best to have HDL of 60 or  higher.  Triglycerides below 150. How can I lower my cholesterol? Diet Follow your diet program as told by your doctor.  Choose fish, white meat chicken, or [place] that is roasted or baked. Try not to eat red meat, fried foods, sausage, or lunch meats.  Eat lots of fresh fruits and vegetables.  Choose whole grains, beans, pasta, potatoes, and cereals.  Choose olive oil, corn oil, or canola oil. Only use small amounts.  Try not to eat butter, mayonnaise, shortening, or palm kernel oils.  Try not to eat foods with trans fats.  Choose low-fat or nonfat dairy foods. ? Drink skim or nonfat milk. ? Eat low-fat or nonfat yogurt and cheeses. ? Try not to drink whole milk or cream. ? Try not to eat ice cream, egg yolks, or full-fat cheeses.  Healthy desserts include angel food cake, ginger snaps, animal crackers, hard candy, popsicles, and low-fat or nonfat frozen yogurt. Try not to eat pastries, cakes, pies, and cookies.  Exercise Follow your exercise program as told by your doctor.  Be more active. Try gardening, walking, and taking the stairs.  Ask your doctor about ways that you can be more active.  Medicine  Take over-the-counter and prescription medicines only as told by your doctor.  This information is not intended to replace advice given to you by your health care provider. Make sure you discuss any questions you have with your health care provider. Document Released: 08/30/2008 Document Revised: 01/03/2016 Document Reviewed: 12/14/2015 Elsevier Interactive Patient Education  2017 [employer].

## 2017-02-10 NOTE — Progress Notes (Signed)
BP 138/78   Pulse 73   Temp 98.3 F (36.8 C) (Oral)   Resp 16   Wt 190 lb 11.2 oz (86.5 kg)   SpO2 97%   BMI 35.33 kg/m    Subjective:    Patient ID: Judith Olson, female    DOB: 12/14/1950, 66 y.o.   MRN: [id number]  HPI: Judith Olson is a 66 y.o. female  Chief Complaint  Patient presents with  . Follow-up    6 months  . Hypertension   HPI She is here for f/u Last thyroid was abnormal She has gained some weight since last visit She is getting back into her exercise Son is a good cook Gained 25 pounds over the last 6 months No one in the family with thyroid trouble Not exercising like before; lots of junk food  Spot on the right breast and then it was gone Reviewed mammogranad the mass was completely gone She does not feel anything abnormal  HTN; checks BP at [facility] in the cafeteria just once in a while; well-controlled; tries to avoid extra salt  Aortic athero; reviewed last lipids; avoiding fatty meats Lab Results  Component Value Date   CHOL 144 11/15/2016   CHOL 143 08/13/2016   CHOL 175 11/20/2015   Lab Results  Component Value Date   HDL 60 11/15/2016   HDL 58 08/13/2016   HDL 48 11/20/2015   Lab Results  Component Value Date   LDLCALC 49 11/15/2016   LDLCALC 70 08/13/2016   LDLCALC 109 (H) 11/20/2015   Lab Results  Component Value Date   TRIG 173 (H) 11/15/2016   TRIG 77 08/13/2016   TRIG 89 11/20/2015   Lab Results  Component Value Date   CHOLHDL 2.4 11/15/2016   CHOLHDL 2.5 08/13/2016   No results found for: LDLDIRECT   She is going to flu shot at work later this year; they are going to give it in September  Depression screen [facility] 2/9 02/10/2017 11/15/2016 08/13/2016 06/06/2016 11/14/2015  Decreased Interest 0 0 0 0 0  Down, Depressed, Hopeless 0 0 0 0 0  PHQ - 2 Score 0 0 0 0 0    Relevant past medical, surgical, family and social history reviewed Past Medical History:  Diagnosis Date  . 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 Essential hypertension, benign 08/04/2016  . Hypertension   . Muscle spasms of both lower extremities   . Obesity 06/06/2016  . Scoliosis of lumbar spine 06/06/2016   Past Surgical History:  Procedure Laterality Date  . ABDOMINAL HYSTERECTOMY  2000   Family History  Problem Relation Age of Onset  . Cancer Father        porstate  . Cancer Sister        breast  . Breast cancer Sister   . Cancer Brother        unknown  . Hypertension Neg Hx    Social History   Social History  . Marital status: Divorced    Spouse name: N/A  . Number of children: N/A  . Years of education: N/A   Occupational History  . Not on file.   Social History Main Topics  . Smoking status: Former Smoker    Types: Cigarettes  . Smokeless tobacco: Never Used  . Alcohol use 0.0 oz/week     Comment: not regularly  .  Drug use: No     Comment: tried marijuana in the past  . Sexual activity: Not Currently   Other Topics Concern  . Not on file   Social History Narrative  . No narrative on file    Interim medical history since last visit reviewed. Allergies and medications reviewed  Review of Systems Per HPI unless specifically indicated above     Objective:    BP 138/78   Pulse 73   Temp 98.3 F (36.8 C) (Oral)   Resp 16   Wt 190 lb 11.2 oz (86.5 kg)   SpO2 97%   BMI 35.33 kg/m   Wt Readings from Last 3 Encounters:  02/10/17 190 lb 11.2 oz (86.5 kg)  11/15/16 182 lb (82.6 kg)  08/13/16 165 lb 5 oz (75 kg)    Physical Exam  Constitutional: She appears well-developed and well-nourished.  Weight gain 25 pounds over last 6 months  HENT:  Mouth/Throat: Mucous membranes are normal.  Eyes: EOM are normal. No scleral icterus.  Neck: No thyromegaly present.  Cardiovascular: Normal rate and regular rhythm.   Pulmonary/Chest: Effort normal and breath sounds  normal.  Abdominal: She exhibits no distension.  Musculoskeletal: She exhibits no edema.  Neurological: She is alert.  Skin: No pallor.  Psychiatric: She has a normal mood and affect. Her behavior is normal.    Results for orders placed or performed in visit on 11/15/16  CBC with Differential/Platelet  Result Value Ref Range   WBC 5.3 3.8 - 10.8 K/uL   RBC 4.29 3.80 - 5.10 MIL/uL   Hemoglobin 12.1 11.7 - 15.5 g/dL   HCT 39.0 35.0 - 45.0 %   MCV 90.9 80.0 - 100.0 fL   MCH 28.2 27.0 - 33.0 pg   MCHC 31.0 (L) 32.0 - 36.0 g/dL   RDW 13.9 11.0 - 15.0 %   Platelets 273 140 - 400 K/uL   MPV 10.6 7.5 - 12.5 fL   Neutro Abs 2,385 1,500 - 7,800 cells/uL   Lymphs Abs 2,385 850 - 3,900 cells/uL   Monocytes Absolute 371 200 - 950 cells/uL   Eosinophils Absolute 159 15 - 500 cells/uL   Basophils Absolute 0 0 - 200 cells/uL   Neutrophils Relative % 45 %   Lymphocytes Relative 45 %   Monocytes Relative 7 %   Eosinophils Relative 3 %   Basophils Relative 0 %   Smear Review Criteria for review not met   COMPLETE METABOLIC PANEL WITH GFR  Result Value Ref Range   Sodium 142 135 - 146 mmol/L   Potassium 3.8 3.5 - 5.3 mmol/L   Chloride 105 98 - 110 mmol/L   CO2 28 20 - 31 mmol/L   Glucose, Bld 100 (H) 65 - 99 mg/dL   BUN 23 7 - 25 mg/dL   Creat 0.92 0.50 - 0.99 mg/dL   Total Bilirubin 0.6 0.2 - 1.2 mg/dL   Alkaline Phosphatase 72 33 - 130 U/L   AST 16 10 - 35 U/L   ALT 11 6 - 29 U/L   Total Protein 7.0 6.1 - 8.1 g/dL   Albumin 4.0 3.6 - 5.1 g/dL   Calcium 9.7 8.6 - 10.4 mg/dL   GFR, Est African American 77 >=60 mL/min   GFR, Est Non African American 66 >=60 mL/min  Lipid panel  Result Value Ref Range   Cholesterol 144 <200 mg/dL   Triglycerides 173 (H) <150 mg/dL   HDL 60 >50 mg/dL   Total CHOL/HDL Ratio 2.4 <  5.0 Ratio   VLDL 35 (H) <30 mg/dL   LDL Cholesterol 49 <100 mg/dL  TSH  Result Value Ref Range   TSH 0.31 (L) mIU/L      Assessment & Plan:   Problem List Items  Addressed This Visit      Cardiovascular and Mediastinum   Essential hypertension, benign (Chronic)    Controlled, avoid excess salt      Relevant Medications   aspirin 81 MG tablet   Aorto-iliac atherosclerosis (HCC) (Chronic)    Continue statin, LDL is at goal; continue healthy eating habits      Relevant Medications   aspirin 81 MG tablet     Musculoskeletal and Integument   Scoliosis of lumbar spine (Chronic)    No longer causing any pain; glad to hear this good news        Other   Obesity    With significant weight gain over the last 6 months; checking thyroid function today; see AVS for ideas on helping with weight loss      Abnormal thyroid function test    Recheck thyroid tests       Other Visit Diagnoses    Abnormal thyroid blood test           Follow up plan: Return in about 6 months (around 08/13/2017) for twenty minute follow-up with fasting labs.  An after-visit summary was printed and given to the patient at [place].  Please see the patient instructions which may contain other information and recommendations beyond what is mentioned above in the assessment and plan.  Meds ordered this encounter  Medications  . aspirin 81 MG tablet    Sig: Take 81 mg by mouth daily.    No orders of the defined types were placed in this encounter.

## 2017-02-10 NOTE — Assessment & Plan Note (Signed)
Controlled, avoid excess salt

## 2017-02-10 NOTE — Assessment & Plan Note (Signed)
No longer causing any pain; glad to hear this good news

## 2017-02-10 NOTE — Assessment & Plan Note (Signed)
With significant weight gain over the last 6 months; checking thyroid function today; see AVS for ideas on helping with weight loss

## 2017-02-10 NOTE — Assessment & Plan Note (Signed)
Recheck thyroid tests

## 2017-02-11 LAB — T4, FREE: FREE T4: 1.3 ng/dL (ref 0.8–1.8)

## 2017-02-11 LAB — TSH: TSH: 0.31 mIU/L — ABNORMAL LOW

## 2017-02-13 ENCOUNTER — Other Ambulatory Visit: Payer: Self-pay

## 2017-02-13 DIAGNOSIS — R7989 Other specified abnormal findings of blood chemistry: Secondary | ICD-10-CM

## 2017-08-14 ENCOUNTER — Ambulatory Visit: Payer: Commercial Managed Care - PPO | Admitting: Family Medicine

## 2017-08-14 ENCOUNTER — Encounter: Payer: Self-pay | Admitting: Family Medicine

## 2017-08-14 VITALS — BP 160/90 | HR 84 | Temp 98.3°F | Ht 61.6 in | Wt 204.0 lb

## 2017-08-14 DIAGNOSIS — I10 Essential (primary) hypertension: Secondary | ICD-10-CM

## 2017-08-14 DIAGNOSIS — Z6837 Body mass index (BMI) 37.0-37.9, adult: Secondary | ICD-10-CM

## 2017-08-14 DIAGNOSIS — E6609 Other obesity due to excess calories: Secondary | ICD-10-CM | POA: Diagnosis not present

## 2017-08-14 DIAGNOSIS — M6790 Unspecified disorder of synovium and tendon, unspecified site: Secondary | ICD-10-CM

## 2017-08-14 DIAGNOSIS — R946 Abnormal results of thyroid function studies: Secondary | ICD-10-CM | POA: Diagnosis not present

## 2017-08-14 DIAGNOSIS — R739 Hyperglycemia, unspecified: Secondary | ICD-10-CM | POA: Diagnosis not present

## 2017-08-14 MED ORDER — LOVASTATIN 40 MG PO TABS: 40.0000 mg | ORAL_TABLET | Freq: Every day | ORAL | 0 refills | Status: DC

## 2017-08-14 MED ORDER — CHLORTHALIDONE 25 MG PO TABS
25.0000 mg | ORAL_TABLET | Freq: Every day | ORAL | 0 refills | Status: DC
Start: 2017-08-14 — End: 2017-11-10

## 2017-08-14 MED ORDER — LISINOPRIL 20 MG PO TABS: 20.0000 mg | ORAL_TABLET | Freq: Every day | ORAL | 0 refills | Status: DC

## 2017-08-14 NOTE — Patient Instructions (Addendum)
Try to follow the DASH guidelines (DASH stands for Dietary Approaches to Stop Hypertension). Try to limit the sodium in your diet to no more than 1,500mg  of sodium per day. Certainly try to not exceed 2,000 mg per day at the very most. Do not add salt when cooking or at the table.  Check the sodium amount on labels when shopping, and choose items lower in sodium when given a choice. Avoid or limit foods that already contain a lot of sodium. Eat a diet rich in fruits and vegetables and whole grains, and try to lose weight if overweight or obese Start back on the blood pressure medicines Start the new cholesterol medicine called lovastatin Have the pharmacist contact me if something is too expensive Check your blood pressure and call me if not under 140/90 Goal is less than 120/80 (that's gets you an A+ for the course)

## 2017-08-14 NOTE — Progress Notes (Signed)
BP (!) 160/90 (BP Location: Left Arm, Patient Position: Sitting, Cuff Size: Large)   Pulse 84   Temp 98.3 F (36.8 C) (Oral)   Ht 5' 1.6" (1.565 m)   Wt 204 lb (92.5 kg)   SpO2 99%   BMI 37.80 kg/m   Today's Vitals   08/14/17 1548  BP: (!) 160/90  Pulse: 84  Temp: 98.3 F (36.8 C)  TempSrc: Oral  SpO2: 99%  Weight: 204 lb (92.5 kg)  Height: 5' 1.6" (1.565 m)    Subjective:    Patient ID: Judith Olson, female    DOB: Jul 15, 1950, 67 y.o.   MRN: [id number]  HPI: Judith Olson is a 67 y.o. female  Chief Complaint  Patient presents with  . Obesity    Pt states that she is "tired of yo-yoing with weight"   . Follow-up  . Arthritis    Pt states that her hands ache from time to time using aspercream     HPI She is here for f/u She has HTN; her medicine went up to $20 and she admits that she hasn't been taking her medicine She is trying to relax She is gaining weight; she wants something to help with her weight She gets started and cuts down on her meals but then  She is going to join [employer] get started and then derailed; would like something for weight loss; she has tried OTC stuff for weight loss; she saw some stuff on Facebook about fat burners She would even consider the lap band surgery or something She has a lump under the skin middle finger right hand Getting older; hands don't hurt hurt, just using aspercreme High cholesterol; that one became more expensive  Depression screen [facility] 2/9 08/14/2017 02/10/2017 11/15/2016 08/13/2016 06/06/2016  Decreased Interest 0 0 0 0 0  Down, Depressed, Hopeless 0 0 0 0 0  PHQ - 2 Score 0 0 0 0 0   Relevant past medical, surgical, family and social history reviewed Past Medical History:  Diagnosis Date  . Aorto-iliac atherosclerosis ([place]) 06/06/2016   Discovered on ER films  . 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 Essential hypertension, benign 08/04/2016  .  Hypertension   . Muscle spasms of both lower extremities   . Obesity 06/06/2016  . Scoliosis of lumbar spine 06/06/2016   Past Surgical History:  Procedure Laterality Date  . ABDOMINAL HYSTERECTOMY  2000   Family History  Problem Relation Age of Onset  . Cancer Father        porstate  . Cancer Sister        breast  . Breast cancer Sister   . Cancer Brother        unknown  . Hypertension Neg Hx    Social History   Tobacco Use  . Smoking status: Former Smoker    Types: Cigarettes  . Smokeless tobacco: Never Used  Substance Use Topics  . Alcohol use: Yes    Alcohol/week: 0.0 oz    Comment: not regularly  . Drug use: No    Comment: tried marijuana in the past    Interim medical history since last visit reviewed. Allergies and medications reviewed  Review of Systems Per HPI unless specifically indicated above     Objective:    BP (!) 160/90 (BP Location: Left Arm, Patient Position: Sitting, Cuff Size: Large)   Pulse 84   Temp 98.3 F (36.8 C) (Oral)  Ht 5' 1.6" (1.565 m)   Wt 204 lb (92.5 kg)   SpO2 99%   BMI 37.80 kg/m   Wt Readings from Last 3 Encounters:  08/14/17 204 lb (92.5 kg)  02/10/17 190 lb 11.2 oz (86.5 kg)  11/15/16 182 lb (82.6 kg)    Physical Exam  Constitutional: She appears well-developed and well-nourished. No distress.  Obese, weight gain noted  HENT:  Head: Normocephalic and atraumatic.  Eyes: EOM are normal. No scleral icterus.  Neck: No thyromegaly present.  Cardiovascular: Normal rate, regular rhythm and normal heart sounds.  No murmur heard. Pulmonary/Chest: Effort normal and breath sounds normal.  Abdominal: Soft. Bowel sounds are normal.  Musculoskeletal: Normal range of motion. She exhibits no edema.  Palpable subcutaneous nodules middle finger right hand, flexor aspect  Neurological: She is alert. She exhibits normal muscle tone.  Skin: Skin is warm and dry. She is not diaphoretic. No pallor.  Psychiatric: She has a normal  mood and affect. Her behavior is normal. Judgment and thought content normal.      Assessment & Plan:   Problem List Items Addressed This Visit      Cardiovascular and Mediastinum   Essential hypertension, benign - Primary (Chronic)    Start back on medicine work on weight loss      Relevant Medications   lovastatin (MEVACOR) 40 MG tablet   lisinopril (PRINIVIL,ZESTRIL) 20 MG tablet   chlorthalidone (HYGROTON) 25 MG tablet     Other   Obesity    Check thyroid first; if normal, then consider medicine to help with weight loss      Abnormal thyroid function test    Recheck labs today      Relevant Orders   TSH (Completed)   T4, free (Completed)    Other Visit Diagnoses    Hyperglycemia       Relevant Orders   Basic metabolic panel (Completed)   Hemoglobin A1c (Completed)   Nodule of flexor tendon sheath       can refer if patient desires       Follow up plan: Return in about 2 weeks (around 08/28/2017) for blood pressure recheck with CMA and BMP.  An after-visit summary was printed and given to the patient at [place].  Please see the patient instructions which may contain other information and recommendations beyond what is mentioned above in the assessment and plan.  Meds ordered this encounter  Medications  . lovastatin (MEVACOR) 40 MG tablet    Sig: Take 1 tablet (40 mg total) by mouth at bedtime.    Dispense:  90 tablet    Refill:  0  . lisinopril (PRINIVIL,ZESTRIL) 20 MG tablet    Sig: Take 1 tablet (20 mg total) by mouth daily.    Dispense:  90 tablet    Refill:  0  . chlorthalidone (HYGROTON) 25 MG tablet    Sig: Take 1 tablet (25 mg total) by mouth daily.    Dispense:  90 tablet    Refill:  0    Orders Placed This Encounter  Procedures  . TSH  . T4, free  . Basic metabolic panel  . Hemoglobin A1c

## 2017-08-14 NOTE — Assessment & Plan Note (Signed)
Check thyroid first; if normal, then consider medicine to help with weight loss

## 2017-08-14 NOTE — Assessment & Plan Note (Signed)
Start back on medicine work on weight loss

## 2017-08-14 NOTE — Assessment & Plan Note (Signed)
Recheck labs today. 

## 2017-08-15 ENCOUNTER — Telehealth: Payer: Self-pay

## 2017-08-15 DIAGNOSIS — E6609 Other obesity due to excess calories: Secondary | ICD-10-CM

## 2017-08-15 DIAGNOSIS — Z6837 Body mass index (BMI) 37.0-37.9, adult: Principal | ICD-10-CM

## 2017-08-15 LAB — HEMOGLOBIN A1C
Hgb A1c MFr Bld: 5.4 % of total Hgb (ref <5.7)
MEAN PLASMA GLUCOSE: 108 (calc)
eAG (mmol/L): 6 (calc)

## 2017-08-15 LAB — BASIC METABOLIC PANEL
BUN: 16 mg/dL (ref 7–25)
CO2: 30 mmol/L (ref 20–32)
Calcium: 9.1 mg/dL (ref 8.6–10.4)
Chloride: 105 mmol/L (ref 98–110)
Creat: 0.96 mg/dL (ref 0.50–0.99)
Glucose, Bld: 93 mg/dL (ref 65–139)
Potassium: 3.7 mmol/L (ref 3.5–5.3)
SODIUM: 141 mmol/L (ref 135–146)

## 2017-08-15 LAB — TSH: TSH: 0.43 mIU/L (ref 0.40–4.50)

## 2017-08-15 LAB — T4, FREE: FREE T4: 1.3 ng/dL (ref 0.8–1.8)

## 2017-08-15 MED ORDER — LIRAGLUTIDE -WEIGHT MANAGEMENT 18 MG/3ML ~~LOC~~ SOPN: 0.6000 mg | PEN_INJECTOR | Freq: Every day | SUBCUTANEOUS | 0 refills | Status: DC

## 2017-08-15 MED ORDER — PEN NEEDLES 31G X 6 MM MISC: 0 refills | Status: DC

## 2017-08-15 NOTE — Telephone Encounter (Signed)
Informed pt of information below. Referral placed for lifestyles.

## 2017-08-15 NOTE — Telephone Encounter (Signed)
-----   Message from Arnetha Courser, MD sent at 08/15/2017  8:05 AM EST ----- Please let pt know that her labs are in the normal ranges; for weight loss, ask if she has every had thyroid cancer or a familial cancer syndrome in her relatives called MEN-2 (very rare); if no to both questions, I can send in medicine for her that's an injectable (back to me for Rx); she'll also need to start working with a dietician (please enter referral, obesity, need for weight loss, Lifestyle center); thank you

## 2017-08-15 NOTE — Telephone Encounter (Signed)
No MEN-2 or thyroid cancer Start Saxenda

## 2017-08-18 ENCOUNTER — Telehealth: Payer: Self-pay

## 2017-08-18 NOTE — Telephone Encounter (Signed)
Insurance will not cover [place] and [facility] has no recommendations. Please advise.

## 2017-08-19 MED ORDER — LORCASERIN HCL 10 MG PO TABS: 1.0000 | ORAL_TABLET | Freq: Two times a day (BID) | ORAL | 1 refills | Status: DC

## 2017-08-19 NOTE — Telephone Encounter (Signed)
They will not cover saxenda; let's try belviq; savings card

## 2017-08-20 NOTE — Telephone Encounter (Signed)
Left detailed voicemail

## 2017-08-28 ENCOUNTER — Ambulatory Visit: Payer: Commercial Managed Care - PPO

## 2017-08-28 VITALS — BP 122/80 | HR 66

## 2017-08-28 DIAGNOSIS — Z5181 Encounter for therapeutic drug level monitoring: Secondary | ICD-10-CM

## 2017-08-28 DIAGNOSIS — I10 Essential (primary) hypertension: Secondary | ICD-10-CM

## 2017-08-29 LAB — BASIC METABOLIC PANEL WITH GFR
BUN: 23 mg/dL (ref 7–25)
CO2: 28 mmol/L (ref 20–32)
Calcium: 9.6 mg/dL (ref 8.6–10.4)
Chloride: 104 mmol/L (ref 98–110)
Creat: 0.96 mg/dL (ref 0.50–0.99)
GFR, EST AFRICAN AMERICAN: 72 mL/min/{1.73_m2} (ref >=60)
GFR, EST NON AFRICAN AMERICAN: 62 mL/min/{1.73_m2} (ref >=60)
Glucose, Bld: 94 mg/dL (ref 65–99)
POTASSIUM: 3.9 mmol/L (ref 3.5–5.3)
Sodium: 140 mmol/L (ref 135–146)

## 2017-11-04 ENCOUNTER — Other Ambulatory Visit: Payer: Self-pay | Admitting: Family Medicine

## 2017-11-10 ENCOUNTER — Other Ambulatory Visit: Payer: Self-pay | Admitting: Family Medicine

## 2017-11-18 ENCOUNTER — Encounter: Payer: Self-pay | Admitting: Family Medicine

## 2017-11-18 ENCOUNTER — Ambulatory Visit (INDEPENDENT_AMBULATORY_CARE_PROVIDER_SITE_OTHER): Payer: Commercial Managed Care - PPO | Admitting: Family Medicine

## 2017-11-18 VITALS — BP 118/62 | HR 99 | Temp 98.4°F | Resp 14 | Ht 62.0 in | Wt 207.9 lb

## 2017-11-18 DIAGNOSIS — Z1231 Encounter for screening mammogram for malignant neoplasm of breast: Secondary | ICD-10-CM | POA: Diagnosis not present

## 2017-11-18 DIAGNOSIS — D2330 Other benign neoplasm of skin of unspecified part of face: Secondary | ICD-10-CM | POA: Diagnosis not present

## 2017-11-18 DIAGNOSIS — Z1239 Encounter for other screening for malignant neoplasm of breast: Secondary | ICD-10-CM

## 2017-11-18 DIAGNOSIS — Z0000 Encounter for general adult medical examination without abnormal findings: Secondary | ICD-10-CM

## 2017-11-18 NOTE — Progress Notes (Signed)
Patient ID: Judith Olson, female   DOB: 1950/07/24, 67 y.o.   MRN: [id number]   Subjective:   Judith Olson is a 67 y.o. female here for a complete physical exam  Interim issues since last visit: no medical excitement  USPSTF grade A and B recommendations Depression:  Depression screen [facility] 2/9 11/18/2017 08/14/2017 02/10/2017 11/15/2016 08/13/2016  Decreased Interest 0 0 0 0 0  Down, Depressed, Hopeless 0 0 0 0 0  PHQ - 2 Score 0 0 0 0 0   Hypertension: BP Readings from Last 3 Encounters:  11/18/17 118/62  08/28/17 122/80  08/14/17 (!) 160/90   Obesity: Wt Readings from Last 3 Encounters:  11/18/17 207 lb 14.4 oz (94.3 kg)  08/14/17 204 lb (92.5 kg)  02/10/17 190 lb 11.2 oz (86.5 kg)   BMI Readings from Last 3 Encounters:  11/18/17 38.03 kg/m  08/14/17 37.80 kg/m  02/10/17 35.33 kg/m     Skin cancer: moles on the face, just bothering her; don't hurt; just cosmetic Lung cancer:  Years ago, quit in the 1970's Breast cancer: due in July 2018; no lumps; ordered Colorectal cancer: done in 2014; no fam hx; she says that they said come back in 10 years Cervical cancer screening: s/p hyst BRCA gene screening: family hx of breast and/or ovarian cancer and/or metastatic prostate cancer? Breast; no ovarian cancer; father had prostate cancer and died from it, not sure where it metastasized; not interested in genetic testing right now HIV, hep B, hep C: n/a STD testing and prevention (chl/gon/syphilis): n/a Intimate partner violence: no abuse Contraception: s/p Osteoporosis: n/a; due at age 82 Fall prevention/vitamin D: discussed; taking vit D Immunizations: flu shots at work; tetanus UTD Diet: pretty good eater Exercise: active at work; no other exercise really; playing with grandkids counts, gets in the pool Alcohol: just occasionally; does not drink and drive Tobacco use: quit in the 1970's AAA: n/a Aspirin: takes aspirin daily Glucose:  Glucose, Bld  Date Value Ref Range  Status  08/28/2017 94 65 - 99 mg/dL Final    Comment:    .            Fasting reference interval .   08/14/2017 93 65 - 139 mg/dL Final    Comment:    .        Non-fasting reference interval .   11/15/2016 100 (H) 65 - 99 mg/dL Final   Lipids:  Lab Results  Component Value Date   CHOL 144 11/15/2016   CHOL 143 08/13/2016   CHOL 175 11/20/2015   Lab Results  Component Value Date   HDL 60 11/15/2016   HDL 58 08/13/2016   HDL 48 11/20/2015   Lab Results  Component Value Date   LDLCALC 49 11/15/2016   LDLCALC 70 08/13/2016   LDLCALC 109 (H) 11/20/2015   Lab Results  Component Value Date   TRIG 173 (H) 11/15/2016   TRIG 77 08/13/2016   TRIG 89 11/20/2015   Lab Results  Component Value Date   CHOLHDL 2.4 11/15/2016   CHOLHDL 2.5 08/13/2016   No results found for: LDLDIRECT   Past Medical History:  Diagnosis Date  . Aorto-iliac atherosclerosis ([place]) 06/06/2016   Discovered on ER films  . 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 Essential hypertension, benign 08/04/2016  . Hypertension   . Muscle spasms of both lower extremities   . Obesity 06/06/2016  . Scoliosis of lumbar spine 06/06/2016  Past Surgical History:  Procedure Laterality Date  . ABDOMINAL HYSTERECTOMY  2000   Family History  Problem Relation Age of Onset  . Cancer Father        porstate  . Cancer Sister        breast  . Breast cancer Sister   . Cancer Brother        unknown  . Hypertension Neg Hx    Social History   Tobacco Use  . Smoking status: Former Smoker    Types: Cigarettes  . Smokeless tobacco: Never Used  Substance Use Topics  . Alcohol use: Yes    Alcohol/week: 0.0 oz    Comment: not regularly  . Drug use: No    Comment: tried marijuana in the past   Review of Systems  Objective:   Vitals:   11/18/17 1415  BP: 118/62  Pulse: 99  Resp: 14  Temp: 98.4 F (36.9 C)  TempSrc: Oral  SpO2: 97%  Weight: 207  lb 14.4 oz (94.3 kg)  Height: '5\' 2"'  (1.575 m)   Body mass index is 38.03 kg/m. Wt Readings from Last 3 Encounters:  11/18/17 207 lb 14.4 oz (94.3 kg)  08/14/17 204 lb (92.5 kg)  02/10/17 190 lb 11.2 oz (86.5 kg)   Physical Exam  Constitutional: She appears well-developed and well-nourished.  HENT:  Head: Normocephalic and atraumatic.  Right Ear: Hearing, tympanic membrane, external ear and ear canal normal.  Left Ear: Hearing, tympanic membrane, external ear and ear canal normal.  Eyes: Conjunctivae and EOM are normal. Right eye exhibits no hordeolum. Left eye exhibits no hordeolum. No scleral icterus.  Neck: Carotid bruit is not present. No thyromegaly present.  Cardiovascular: Normal rate, regular rhythm, S1 normal, S2 normal and normal heart sounds.  No extrasystoles are present.  Pulmonary/Chest: Effort normal and breath sounds normal. No respiratory distress. Right breast exhibits no inverted nipple, no mass, no nipple discharge, no skin change and no tenderness. Left breast exhibits no inverted nipple, no mass, no nipple discharge, no skin change and no tenderness. Breasts are symmetrical.  Abdominal: Soft. Normal appearance and bowel sounds are normal. She exhibits no distension, no abdominal bruit, no pulsatile midline mass and no mass. There is no hepatosplenomegaly. There is no tenderness. No hernia.  Musculoskeletal: Normal range of motion. She exhibits no edema.  Lymphadenopathy:       Head (right side): No submandibular adenopathy present.       Head (left side): No submandibular adenopathy present.    She has no cervical adenopathy.    She has no axillary adenopathy.  Neurological: She is alert. She displays no tremor. No cranial nerve deficit. She exhibits normal muscle tone. Gait normal.  Reflex Scores:      Patellar reflexes are 2+ on the right side and 2+ on the left side. Skin: Skin is warm and dry. No bruising and no ecchymosis noted. No cyanosis. No pallor.  Darkly  pigmented papules on the face  Psychiatric: Her speech is normal and behavior is normal. Thought content normal. Her mood appears not anxious. She does not exhibit a depressed mood.    Assessment/Plan:   Problem List Items Addressed This Visit      Other   Preventative health care - Primary    USPSTF grade A and B recommendations reviewed with patient; age-appropriate recommendations, preventive care, screening tests, etc discussed and encouraged; healthy living encouraged; see AVS for patient education given to patient  Other Visit Diagnoses    Screening for breast cancer       Relevant Orders   MM Digital Screening   Benign neoplasm of skin of face       Relevant Orders   Ambulatory referral to Dermatology       No orders of the defined types were placed in this encounter.  Orders Placed This Encounter  Procedures  . MM Digital Screening    Standing Status:   Future    Standing Expiration Date:   01/19/2019    Order Specific Question:   Reason for Exam (SYMPTOM  OR DIAGNOSIS REQUIRED)    Answer:   screen for breast cancer    Order Specific Question:   Preferred imaging location?    Answer:   [facility]  . Ambulatory referral to Dermatology    Referral Priority:   Routine    Referral Type:   Consultation    Referral Reason:   Specialty Services Required    Requested Specialty:   Dermatology    Number of Visits Requested:   1    Follow up plan: No follow-ups on file.  An After Visit Summary was printed and given to the patient.

## 2017-11-18 NOTE — Assessment & Plan Note (Signed)
USPSTF grade A and B recommendations reviewed with patient; age-appropriate recommendations, preventive care, screening tests, etc discussed and encouraged; healthy living encouraged; see AVS for patient education given to patient  

## 2017-11-18 NOTE — Patient Instructions (Addendum)
Consider getting the new shingles vaccine called Shingrix; that is available for individuals 65 years of age and older, and is recommended even if you have had shingles in the past and/or already received the old shingles vaccine (Zostavax); it is a two-part series, and is available at many local pharmacies  Health Maintenance, Female Adopting a healthy lifestyle and getting preventive care can go a long way to promote health and wellness. Talk with your health care provider about what schedule of regular examinations is right for you. This is a good chance for you to check in with your provider about disease prevention and staying healthy. In between checkups, there are plenty of things you can do on your own. Experts have done a lot of research about which lifestyle changes and preventive measures are most likely to keep you healthy. Ask your health care provider for more information. Weight and diet Eat a healthy diet  Be sure to include plenty of vegetables, fruits, low-fat dairy products, and lean protein.  Do not eat a lot of foods high in solid fats, added sugars, or salt.  Get regular exercise. This is one of the most important things you can do for your health. ? Most adults should exercise for at least 150 minutes each week. The exercise should increase your heart rate and make you sweat (moderate-intensity exercise). ? Most adults should also do strengthening exercises at least twice a week. This is in addition to the moderate-intensity exercise.  Maintain a healthy weight  Body mass index (BMI) is a measurement that can be used to identify possible weight problems. It estimates body fat based on height and weight. Your health care provider can help determine your BMI and help you achieve or maintain a healthy weight.  For females 76 years of age and older: ? A BMI below 18.5 is considered underweight. ? A BMI of 18.5 to 24.9 is normal. ? A BMI of 25 to 29.9 is considered  overweight. ? A BMI of 30 and above is considered obese.  Watch levels of cholesterol and blood lipids  You should start having your blood tested for lipids and cholesterol at 65 years of age, then have this test every 5 years.  You may need to have your cholesterol levels checked more often if: ? Your lipid or cholesterol levels are high. ? You are older than 65 years of age. ? You are at high risk for heart disease.  Cancer screening Lung Cancer  Lung cancer screening is recommended for adults 11-67 years old who are at high risk for lung cancer because of a history of smoking.  A yearly low-dose CT scan of the lungs is recommended for people who: ? Currently smoke. ? Have quit within the past 15 years. ? Have at least a 30-pack-year history of smoking. A pack year is smoking an average of one pack of cigarettes a day for 1 year.  Yearly screening should continue until it has been 15 years since you quit.  Yearly screening should stop if you develop a health problem that would prevent you from having lung cancer treatment.  Breast Cancer  Practice breast self-awareness. This means understanding how your breasts normally appear and feel.  It also means doing regular breast self-exams. Let your health care provider know about any changes, no matter how small.  If you are in your 20s or 30s, you should have a clinical breast exam (CBE) by a health care provider every 1-3 years as part  of a regular health exam.  If you are 30 or older, have a CBE every year. Also consider having a breast X-ray (mammogram) every year.  If you have a family history of breast cancer, talk to your health care provider about genetic screening.  If you are at high risk for breast cancer, talk to your health care provider about having an MRI and a mammogram every year.  Breast cancer gene (BRCA) assessment is recommended for women who have family members with BRCA-related cancers. BRCA-related cancers  include: ? Breast. ? Ovarian. ? Tubal. ? Peritoneal cancers.  Results of the assessment will determine the need for genetic counseling and BRCA1 and BRCA2 testing.  Cervical Cancer Your health care provider may recommend that you be screened regularly for cancer of the pelvic organs (ovaries, uterus, and vagina). This screening involves a pelvic examination, including checking for microscopic changes to the surface of your cervix (Pap test). You may be encouraged to have this screening done every 3 years, beginning at age 70.  For women ages 17-65, health care providers may recommend pelvic exams and Pap testing every 3 years, or they may recommend the Pap and pelvic exam, combined with testing for human papilloma virus (HPV), every 5 years. Some types of HPV increase your risk of cervical cancer. Testing for HPV may also be done on women of any age with unclear Pap test results.  Other health care providers may not recommend any screening for nonpregnant women who are considered low risk for pelvic cancer and who do not have symptoms. Ask your health care provider if a screening pelvic exam is right for you.  If you have had past treatment for cervical cancer or a condition that could lead to cancer, you need Pap tests and screening for cancer for at least 20 years after your treatment. If Pap tests have been discontinued, your risk factors (such as having a new sexual partner) need to be reassessed to determine if screening should resume. Some women have medical problems that increase the chance of getting cervical cancer. In these cases, your health care provider may recommend more frequent screening and Pap tests.  Colorectal Cancer  This type of cancer can be detected and often prevented.  Routine colorectal cancer screening usually begins at 65 years of age and continues through 65 years of age.  Your health care provider may recommend screening at an earlier age if you have risk factors  for colon cancer.  Your health care provider may also recommend using home test kits to check for hidden blood in the stool.  A small camera at the end of a tube can be used to examine your colon directly (sigmoidoscopy or colonoscopy). This is done to check for the earliest forms of colorectal cancer.  Routine screening usually begins at age 85.  Direct examination of the colon should be repeated every 5-10 years through 65 years of age. However, you may need to be screened more often if early forms of precancerous polyps or small growths are found.  Skin Cancer  Check your skin from head to toe regularly.  Tell your health care provider about any new moles or changes in moles, especially if there is a change in a mole's shape or color.  Also tell your health care provider if you have a mole that is larger than the size of a pencil eraser.  Always use sunscreen. Apply sunscreen liberally and repeatedly throughout the day.  Protect yourself by wearing long  sleeves, pants, a wide-brimmed hat, and sunglasses whenever you are outside.  Heart disease, diabetes, and high blood pressure  High blood pressure causes heart disease and increases the risk of stroke. High blood pressure is more likely to develop in: ? People who have blood pressure in the high end of the normal range (130-139/85-89 mm Hg). ? People who are overweight or obese. ? People who are African American.  If you are 93-79 years of age, have your blood pressure checked every 3-5 years. If you are 85 years of age or older, have your blood pressure checked every year. You should have your blood pressure measured twice-once when you are at a hospital or clinic, and once when you are not at a hospital or clinic. Record the average of the two measurements. To check your blood pressure when you are not at a hospital or clinic, you can use: ? An automated blood pressure machine at a pharmacy. ? A home blood pressure monitor.  If  you are between 74 years and 26 years old, ask your health care provider if you should take aspirin to prevent strokes.  Have regular diabetes screenings. This involves taking a blood sample to check your fasting blood sugar level. ? If you are at a normal weight and have a low risk for diabetes, have this test once every three years after 65 years of age. ? If you are overweight and have a high risk for diabetes, consider being tested at a younger age or more often. Preventing infection Hepatitis B  If you have a higher risk for hepatitis B, you should be screened for this virus. You are considered at high risk for hepatitis B if: ? You were born in a country where hepatitis B is common. Ask your health care provider which countries are considered high risk. ? Your parents were born in a high-risk country, and you have not been immunized against hepatitis B (hepatitis B vaccine). ? You have HIV or AIDS. ? You use needles to inject street drugs. ? You live with someone who has hepatitis B. ? You have had sex with someone who has hepatitis B. ? You get hemodialysis treatment. ? You take certain medicines for conditions, including cancer, organ transplantation, and autoimmune conditions.  Hepatitis C  Blood testing is recommended for: ? Everyone born from [birth year] through 1963. ? Anyone with known risk factors for hepatitis C.  Sexually transmitted infections (STIs)  You should be screened for sexually transmitted infections (STIs) including gonorrhea and chlamydia if: ? You are sexually active and are younger than 65 years of age. ? You are older than 65 years of age and your health care provider tells you that you are at risk for this type of infection. ? Your sexual activity has changed since you were last screened and you are at an increased risk for chlamydia or gonorrhea. Ask your health care provider if you are at risk.  If you do not have HIV, but are at risk, it may be recommended  that you take a prescription medicine daily to prevent HIV infection. This is called pre-exposure prophylaxis (PrEP). You are considered at risk if: ? You are sexually active and do not regularly use condoms or know the HIV status of your partner(s). ? You take drugs by injection. ? You are sexually active with a partner who has HIV.  Talk with your health care provider about whether you are at high risk of being infected with HIV. If  you choose to begin PrEP, you should first be tested for HIV. You should then be tested every 3 months for as long as you are taking PrEP. Pregnancy  If you are premenopausal and you may become pregnant, ask your health care provider about preconception counseling.  If you may become pregnant, take 400 to 800 micrograms (mcg) of folic acid every day.  If you want to prevent pregnancy, talk to your health care provider about birth control (contraception). Osteoporosis and menopause  Osteoporosis is a disease in which the bones lose minerals and strength with aging. This can result in serious bone fractures. Your risk for osteoporosis can be identified using a bone density scan.  If you are 40 years of age or older, or if you are at risk for osteoporosis and fractures, ask your health care provider if you should be screened.  Ask your health care provider whether you should take a calcium or vitamin D supplement to lower your risk for osteoporosis.  Menopause may have certain physical symptoms and risks.  Hormone replacement therapy may reduce some of these symptoms and risks. Talk to your health care provider about whether hormone replacement therapy is right for you. Follow these instructions at home:  Schedule regular health, dental, and eye exams.  Stay current with your immunizations.  Do not use any tobacco products including cigarettes, chewing tobacco, or electronic cigarettes.  If you are pregnant, do not drink alcohol.  If you are  breastfeeding, limit how much and how often you drink alcohol.  Limit alcohol intake to no more than 1 drink per day for nonpregnant women. One drink equals 12 ounces of beer, 5 ounces of wine, or 1 ounces of hard liquor.  Do not use street drugs.  Do not share needles.  Ask your health care provider for help if you need support or information about quitting drugs.  Tell your health care provider if you often feel depressed.  Tell your health care provider if you have ever been abused or do not feel safe at home. This information is not intended to replace advice given to you by your health care provider. Make sure you discuss any questions you have with your health care provider. Document Released: 12/17/2010 Document Revised: 11/09/2015 Document Reviewed: 03/07/2015 Elsevier Interactive Patient Education  2018 [employer].  Return for a [place] visit in the winter after you turn 57 and get Medicare

## 2018-01-17 ENCOUNTER — Other Ambulatory Visit: Payer: Self-pay | Admitting: Family Medicine

## 2018-01-19 MED ORDER — LISINOPRIL 20 MG PO TABS: 20.0000 mg | ORAL_TABLET | Freq: Every day | ORAL | 2 refills | Status: DC

## 2018-01-19 NOTE — Telephone Encounter (Signed)
Last Cr and K+ reviewed; Rx approved 

## 2018-04-10 ENCOUNTER — Other Ambulatory Visit: Payer: Self-pay | Admitting: Family Medicine

## 2018-04-10 DIAGNOSIS — I708 Atherosclerosis of other arteries: Secondary | ICD-10-CM

## 2018-04-10 DIAGNOSIS — Z5181 Encounter for therapeutic drug level monitoring: Secondary | ICD-10-CM

## 2018-04-10 DIAGNOSIS — I700 Atherosclerosis of aorta: Secondary | ICD-10-CM

## 2018-04-10 NOTE — Telephone Encounter (Signed)
Please let patient know that I sent her refills, but it's time to check her electrolytes and cholesterol and liver enzyme; I've ordered those; thank you

## 2018-04-10 NOTE — Telephone Encounter (Signed)
Left detailed voicemial 

## 2018-04-17 ENCOUNTER — Other Ambulatory Visit: Payer: Self-pay

## 2018-04-17 DIAGNOSIS — Z5181 Encounter for therapeutic drug level monitoring: Secondary | ICD-10-CM

## 2018-04-17 DIAGNOSIS — I700 Atherosclerosis of aorta: Secondary | ICD-10-CM

## 2018-04-17 DIAGNOSIS — I708 Atherosclerosis of other arteries: Principal | ICD-10-CM

## 2018-04-17 LAB — COMPLETE METABOLIC PANEL WITH GFR
AG RATIO: 1.4 (calc) (ref 1.0–2.5)
ALBUMIN MSPROF: 4.2 g/dL (ref 3.6–5.1)
ALT: 15 U/L (ref 6–29)
AST: 18 U/L (ref 10–35)
Alkaline phosphatase (APISO): 76 U/L (ref 33–130)
BUN/Creatinine Ratio: 20 (calc) (ref 6–22)
BUN: 21 mg/dL (ref 7–25)
CALCIUM: 9.6 mg/dL (ref 8.6–10.4)
CO2: 30 mmol/L (ref 20–32)
Chloride: 100 mmol/L (ref 98–110)
Creat: 1.07 mg/dL — ABNORMAL HIGH (ref 0.50–0.99)
GFR, EST AFRICAN AMERICAN: 64 mL/min/{1.73_m2} (ref >=60)
GFR, EST NON AFRICAN AMERICAN: 55 mL/min/{1.73_m2} — AB (ref >=60)
GLOBULIN: 3.1 g/dL (ref 1.9–3.7)
Glucose, Bld: 88 mg/dL (ref 65–139)
Potassium: 3.2 mmol/L — ABNORMAL LOW (ref 3.5–5.3)
SODIUM: 138 mmol/L (ref 135–146)
TOTAL PROTEIN: 7.3 g/dL (ref 6.1–8.1)
Total Bilirubin: 0.4 mg/dL (ref 0.2–1.2)

## 2018-04-17 LAB — LIPID PANEL
CHOLESTEROL: 142 mg/dL (ref <200)
HDL: 43 mg/dL — AB (ref >50)
LDL Cholesterol (Calc): 84 mg/dL (calc)
Non-HDL Cholesterol (Calc): 99 mg/dL (calc) (ref <130)
Total CHOL/HDL Ratio: 3.3 (calc) (ref <5.0)
Triglycerides: 73 mg/dL (ref <150)

## 2018-04-19 ENCOUNTER — Other Ambulatory Visit: Payer: Self-pay | Admitting: Family Medicine

## 2018-04-19 MED ORDER — POTASSIUM CHLORIDE ER 10 MEQ PO TBCR
10.0000 meq | EXTENDED_RELEASE_TABLET | Freq: Every day | ORAL | 0 refills | Status: DC
Start: 2018-04-19 — End: 2019-01-15

## 2018-04-19 NOTE — Progress Notes (Signed)
belviq removed from med list Short term potassium Recheck BMP in 4 weeks

## 2018-04-20 ENCOUNTER — Telehealth: Payer: Self-pay

## 2018-04-20 DIAGNOSIS — E876 Hypokalemia: Secondary | ICD-10-CM

## 2018-04-20 DIAGNOSIS — N289 Disorder of kidney and ureter, unspecified: Secondary | ICD-10-CM

## 2018-04-20 NOTE — Telephone Encounter (Signed)
-----   Message from Arnetha Courser, MD sent at 04/19/2018  7:45 PM EST ----- Roselyn Reef, please let the patient know that her HDL has come down some; weight loss and exercise will help this; her TG have really improved; her LDL is pretty good, so continue mevacor Her kidney function declined a little, so avoid NSAIDs, stay hydrated, and avoid red meat if possible Her potassium is low; I've sent in five days worth of replacement Return in 4 weeks for recheck BMP (please ORDER, dx hypokalemia, renal insufficiency); thank you

## 2018-05-21 ENCOUNTER — Ambulatory Visit: Payer: Commercial Managed Care - PPO | Admitting: Family Medicine

## 2018-07-13 ENCOUNTER — Other Ambulatory Visit: Payer: Self-pay | Admitting: Family Medicine

## 2018-07-13 NOTE — Telephone Encounter (Signed)
Patient was due for recheck labs at the end of November Please ask her to come in as soon as convenient for those last I'll send limited Rx for five days of thiazide Okay for cholesterol med

## 2018-07-14 NOTE — Telephone Encounter (Signed)
Left detailed voicemail

## 2018-07-31 ENCOUNTER — Telehealth: Payer: Self-pay | Admitting: Family Medicine

## 2018-07-31 NOTE — Telephone Encounter (Signed)
Going through expired orders Patient was due to have BMP drawn 4 weeks after the one in early November Please ask her to schedule an appointment to see me or Benjamine Mola for her HTN, high cholesterol, and low potassium; we'll want to see her and get labs that day; she does not need to fast Thank you

## 2018-08-03 NOTE — Telephone Encounter (Signed)
lvm asking pt to schedule appt.  °

## 2019-01-15 ENCOUNTER — Other Ambulatory Visit: Payer: Self-pay

## 2019-01-15 ENCOUNTER — Encounter: Payer: Self-pay | Admitting: Nurse Practitioner

## 2019-01-15 ENCOUNTER — Ambulatory Visit (INDEPENDENT_AMBULATORY_CARE_PROVIDER_SITE_OTHER): Payer: Medicare Other

## 2019-01-15 ENCOUNTER — Ambulatory Visit (INDEPENDENT_AMBULATORY_CARE_PROVIDER_SITE_OTHER): Payer: Medicare Other | Admitting: Nurse Practitioner

## 2019-01-15 VITALS — BP 148/98 | HR 96 | Temp 96.9°F | Resp 16 | Ht 62.0 in | Wt 180.6 lb

## 2019-01-15 VITALS — BP 130/82 | HR 96 | Temp 96.9°F | Resp 14 | Ht 62.0 in | Wt 180.6 lb

## 2019-01-15 DIAGNOSIS — I700 Atherosclerosis of aorta: Secondary | ICD-10-CM | POA: Diagnosis not present

## 2019-01-15 DIAGNOSIS — Z13820 Encounter for screening for osteoporosis: Secondary | ICD-10-CM

## 2019-01-15 DIAGNOSIS — Z23 Encounter for immunization: Secondary | ICD-10-CM

## 2019-01-15 DIAGNOSIS — I10 Essential (primary) hypertension: Secondary | ICD-10-CM | POA: Diagnosis not present

## 2019-01-15 DIAGNOSIS — M47816 Spondylosis without myelopathy or radiculopathy, lumbar region: Secondary | ICD-10-CM | POA: Diagnosis not present

## 2019-01-15 DIAGNOSIS — Z0000 Encounter for general adult medical examination without abnormal findings: Secondary | ICD-10-CM | POA: Diagnosis not present

## 2019-01-15 DIAGNOSIS — Z780 Asymptomatic menopausal state: Secondary | ICD-10-CM

## 2019-01-15 DIAGNOSIS — Z5181 Encounter for therapeutic drug level monitoring: Secondary | ICD-10-CM

## 2019-01-15 DIAGNOSIS — Z1239 Encounter for other screening for malignant neoplasm of breast: Secondary | ICD-10-CM | POA: Diagnosis not present

## 2019-01-15 DIAGNOSIS — Z1231 Encounter for screening mammogram for malignant neoplasm of breast: Secondary | ICD-10-CM

## 2019-01-15 DIAGNOSIS — I708 Atherosclerosis of other arteries: Secondary | ICD-10-CM

## 2019-01-15 NOTE — Patient Instructions (Signed)
Ms. Judith Olson , Thank you for taking time to come for your Medicare Wellness Visit. I appreciate your ongoing commitment to your health goals. Please review the following plan we discussed and let me know if I can assist you in the future.   Screening recommendations/referrals: Colonoscopy: done 06/07/13. Repeat in 2024.  Mammogram: done 01/02/17. Please call [phone] to schedule your mammogram and bone density screening.  Recommended yearly ophthalmology/optometry visit for glaucoma screening and checkup Recommended yearly dental visit for hygiene and checkup  Vaccinations: Influenza vaccine: done 03/2018 Pneumococcal vaccine: done today Tdap vaccine: done 08/13/16 Shingles vaccine: Shingrix discussed. Please contact your pharmacy for coverage information.   Advanced directives: Advance directive discussed with you today. I have provided a copy for you to complete at home and have notarized. Once this is complete please bring a copy in to our office so we can scan it into your chart.  Conditions/risks identified: recommend continue healthy eating and physical activity for desired weight loss  Next appointment: Please follow up in one year for your Medicare Annual Wellness visit.     Preventive Care 66 Years and Older, Female Preventive care refers to lifestyle choices and visits with your health care provider that can promote health and wellness. What does preventive care include?  A yearly physical exam. This is also called an annual well check.  Dental exams once or twice a year.  Routine eye exams. Ask your health care provider how often you should have your eyes checked.  Personal lifestyle choices, including:  Daily care of your teeth and gums.  Regular physical activity.  Eating a healthy diet.  Avoiding tobacco and drug use.  Limiting alcohol use.  Practicing safe sex.  Taking low-dose aspirin every day.  Taking vitamin and mineral supplements as recommended by  your health care provider. What happens during an annual well check? The services and screenings done by your health care provider during your annual well check will depend on your age, overall health, lifestyle risk factors, and family history of disease. Counseling  Your health care provider may ask you questions about your:  Alcohol use.  Tobacco use.  Drug use.  Emotional well-being.  Home and relationship well-being.  Sexual activity.  Eating habits.  History of falls.  Memory and ability to understand (cognition).  Work and work Statistician.  Reproductive health. Screening  You may have the following tests or measurements:  Height, weight, and BMI.  Blood pressure.  Lipid and cholesterol levels. These may be checked every 5 years, or more frequently if you are over 66 years old.  Skin check.  Lung cancer screening. You may have this screening every year starting at age 25 if you have a 30-pack-year history of smoking and currently smoke or have quit within the past 15 years.  Fecal occult blood test (FOBT) of the stool. You may have this test every year starting at age 48.  Flexible sigmoidoscopy or colonoscopy. You may have a sigmoidoscopy every 5 years or a colonoscopy every 10 years starting at age 66.  Hepatitis C blood test.  Hepatitis B blood test.  Sexually transmitted disease (STD) testing.  Diabetes screening. This is done by checking your blood sugar (glucose) after you have not eaten for a while (fasting). You may have this done every 1-3 years.  Bone density scan. This is done to screen for osteoporosis. You may have this done starting at age 66.  Mammogram. This may be done every 1-2 years. Talk to  your health care provider about how often you should have regular mammograms. Talk with your health care provider about your test results, treatment options, and if necessary, the need for more tests. Vaccines  Your health care provider may  recommend certain vaccines, such as:  Influenza vaccine. This is recommended every year.  Tetanus, diphtheria, and acellular pertussis (Tdap, Td) vaccine. You may need a Td booster every 10 years.  Zoster vaccine. You may need this after age 13.  Pneumococcal 13-valent conjugate (PCV13) vaccine. One dose is recommended after age 66.  Pneumococcal polysaccharide (PPSV23) vaccine. One dose is recommended after age 66. Talk to your health care provider about which screenings and vaccines you need and how often you need them. This information is not intended to replace advice given to you by your health care provider. Make sure you discuss any questions you have with your health care provider. Document Released: 06/30/2015 Document Revised: 02/21/2016 Document Reviewed: 04/04/2015 Elsevier Interactive Patient Education  2017 [place] Prevention in the Home Falls can cause injuries. They can happen to people of all ages. There are many things you can do to make your home safe and to help prevent falls. What can I do on the outside of my home?  Regularly fix the edges of walkways and driveways and fix any cracks.  Remove anything that might make you trip as you walk through a door, such as a raised step or threshold.  Trim any bushes or trees on the path to your home.  Use bright outdoor lighting.  Clear any walking paths of anything that might make someone trip, such as rocks or tools.  Regularly check to see if handrails are loose or broken. Make sure that both sides of any steps have handrails.  Any raised decks and porches should have guardrails on the edges.  Have any leaves, snow, or ice cleared regularly.  Use sand or salt on walking paths during winter.  Clean up any spills in your garage right away. This includes oil or grease spills. What can I do in the bathroom?  Use night lights.  Install grab bars by the toilet and in the tub and shower. Do not use towel  bars as grab bars.  Use non-skid mats or decals in the tub or shower.  If you need to sit down in the shower, use a plastic, non-slip stool.  Keep the floor dry. Clean up any water that spills on the floor as soon as it happens.  Remove soap buildup in the tub or shower regularly.  Attach bath mats securely with double-sided non-slip rug tape.  Do not have throw rugs and other things on the floor that can make you trip. What can I do in the bedroom?  Use night lights.  Make sure that you have a light by your bed that is easy to reach.  Do not use any sheets or blankets that are too big for your bed. They should not hang down onto the floor.  Have a firm chair that has side arms. You can use this for support while you get dressed.  Do not have throw rugs and other things on the floor that can make you trip. What can I do in the kitchen?  Clean up any spills right away.  Avoid walking on wet floors.  Keep items that you use a lot in easy-to-reach places.  If you need to reach something above you, use a strong step stool that has  a grab bar.  Keep electrical cords out of the way.  Do not use floor polish or wax that makes floors slippery. If you must use wax, use non-skid floor wax.  Do not have throw rugs and other things on the floor that can make you trip. What can I do with my stairs?  Do not leave any items on the stairs.  Make sure that there are handrails on both sides of the stairs and use them. Fix handrails that are broken or loose. Make sure that handrails are as long as the stairways.  Check any carpeting to make sure that it is firmly attached to the stairs. Fix any carpet that is loose or worn.  Avoid having throw rugs at the top or bottom of the stairs. If you do have throw rugs, attach them to the floor with carpet tape.  Make sure that you have a light switch at the top of the stairs and the bottom of the stairs. If you do not have them, ask someone to  add them for you. What else can I do to help prevent falls?  Wear shoes that:  Do not have high heels.  Have rubber bottoms.  Are comfortable and fit you well.  Are closed at the toe. Do not wear sandals.  If you use a stepladder:  Make sure that it is fully opened. Do not climb a closed stepladder.  Make sure that both sides of the stepladder are locked into place.  Ask someone to hold it for you, if possible.  Clearly mark and make sure that you can see:  Any grab bars or handrails.  First and last steps.  Where the edge of each step is.  Use tools that help you move around (mobility aids) if they are needed. These include:  Canes.  Walkers.  Scooters.  Crutches.  Turn on the lights when you go into a dark area. Replace any light bulbs as soon as they burn out.  Set up your furniture so you have a clear path. Avoid moving your furniture around.  If any of your floors are uneven, fix them.  If there are any pets around you, be aware of where they are.  Review your medicines with your doctor. Some medicines can make you feel dizzy. This can increase your chance of falling. Ask your doctor what other things that you can do to help prevent falls. This information is not intended to replace advice given to you by your health care provider. Make sure you discuss any questions you have with your health care provider. Document Released: 03/30/2009 Document Revised: 11/09/2015 Document Reviewed: 07/08/2014 Elsevier Interactive Patient Education  2017 Elsevier Inc.    Pneumococcal Conjugate Vaccine (PCV13): What You Need to Know 1. Why get vaccinated? Pneumococcal conjugate vaccine (PCV13) can prevent pneumococcal disease. Pneumococcal disease refers to any illness caused by pneumococcal bacteria. These bacteria can cause many types of illnesses, including pneumonia, which is an infection of the lungs. Pneumococcal bacteria are one of the most common causes of  pneumonia. Besides pneumonia, pneumococcal bacteria can also cause:  Ear infections  Sinus infections  Meningitis (infection of the tissue covering the brain and spinal cord)  Bacteremia (bloodstream infection) Anyone can get pneumococcal disease, but children under 3 years of age, people with certain medical conditions, adults 74 years or older, and cigarette smokers are at the highest risk. Most pneumococcal infections are mild. However, some can result in long-term problems, such as brain damage  or hearing loss. Meningitis, bacteremia, and pneumonia caused by pneumococcal disease can be fatal. 2. PCV13 PCV13 protects against 13 types of bacteria that cause pneumococcal disease. Infants and young children usually need 4 doses of pneumococcal conjugate vaccine, at 2, 4, 6, and 44-77 months of age. In some cases, a child might need fewer than 4 doses to complete PCV13 vaccination. A dose of PCV23 vaccine is also recommended for anyone 2 years or older with certain medical conditions if they did not already receive PCV13. This vaccine may be given to adults 2 years or older based on discussions between the patient and health care provider. 3. Talk with your health care provider Tell your vaccine provider if the person getting the vaccine:  Has had an allergic reaction after a previous dose of PCV13, to an earlier pneumococcal conjugate vaccine known as PCV7, or to any vaccine containing diphtheria toxoid (for example, DTaP), or has any severe, life-threatening allergies.  In some cases, your health care provider may decide to postpone PCV13 vaccination to a future visit. People with minor illnesses, such as a cold, may be vaccinated. People who are moderately or severely ill should usually wait until they recover before getting PCV13. Your health care provider can give you more information. 4. Risks of a vaccine reaction  Redness, swelling, pain, or tenderness where the shot is given, and  fever, loss of appetite, fussiness (irritability), feeling tired, headache, and chills can happen after PCV13. Young children may be at increased risk for seizures caused by fever after PCV13 if it is administered at the same time as inactivated influenza vaccine. Ask your health care provider for more information. People sometimes faint after medical procedures, including vaccination. Tell your provider if you feel dizzy or have vision changes or ringing in the ears. As with any medicine, there is a very remote chance of a vaccine causing a severe allergic reaction, other serious injury, or death. 5. What if there is a serious problem? An allergic reaction could occur after the vaccinated person leaves the clinic. If you see signs of a severe allergic reaction (hives, swelling of the face and throat, difficulty breathing, a fast heartbeat, dizziness, or weakness), call 9-1-1 and get the person to the nearest hospital. For other signs that concern you, call your health care provider. Adverse reactions should be reported to the Vaccine Adverse Event Reporting System (VAERS). Your health care provider will usually file this report, or you can do it yourself. Visit the VAERS website at www.vaers.SamedayNews.es or call (419)161-5049. VAERS is only for reporting reactions, and VAERS staff do not give medical advice. 6. The National Vaccine Injury Compensation Program The [employer] Vaccine Injury Compensation Program (VICP) is a federal program that was created to compensate people who may have been injured by certain vaccines. Visit the VICP website at GoldCloset.com.ee or call (503)594-8306 to learn about the program and about filing a claim. There is a time limit to file a claim for compensation. 7. How can I learn more?  Ask your health care provider.  Call your local or state health department.  Contact the Centers for Disease Control and Prevention (CDC): ? Call 517-268-8778  (1-800-CDC-INFO) or ? Visit CDC's website at http://hunter.com/ Vaccine Information Statement PCV13 Vaccine (04/15/2018) This information is not intended to replace advice given to you by your health care provider. Make sure you discuss any questions you have with your health care provider. Document Released: 03/31/2006 Document Revised: 09/22/2018 Document Reviewed: 01/13/2018 Elsevier Patient Education  2020 Elsevier Inc.  

## 2019-01-15 NOTE — Patient Instructions (Addendum)
Please do call to schedule your mammogram & DEXA scan; the number to schedule one at either [facility] or [facility] is [phone]   [place] stands for "Dietary Approaches to Stop Hypertension." The DASH eating plan is a healthy eating plan that has been shown to reduce high blood pressure (hypertension). It may also reduce your risk for type 2 diabetes, heart disease, and stroke. The DASH eating plan may also help with weight loss. What are tips for following this plan?  General guidelines  Avoid eating more than 2,300 mg (milligrams) of salt (sodium) a day. If you have hypertension, you may need to reduce your sodium intake to 1,500 mg a day.  Limit alcohol intake to no more than 1 drink a day for nonpregnant women and 2 drinks a day for men. One drink equals 12 oz of beer, 5 oz of wine, or 1 oz of hard liquor.  Work with your health care provider to maintain a healthy body weight or to lose weight. Ask what an ideal weight is for you.  Get at least 30 minutes of exercise that causes your heart to beat faster (aerobic exercise) most days of the week. Activities may include walking, swimming, or biking.  Work with your health care provider or diet and nutrition specialist (dietitian) to adjust your eating plan to your individual calorie needs. Reading food labels   Check food labels for the amount of sodium per serving. Choose foods with less than 5 percent of the Daily Value of sodium. Generally, foods with less than 300 mg of sodium per serving fit into this eating plan.  To find whole grains, look for the word "whole" as the first word in the ingredient list. Shopping  Buy products labeled as "low-sodium" or "no salt added."  Buy fresh foods. Avoid canned foods and premade or frozen meals. Cooking  Avoid adding salt when cooking. Use salt-free seasonings or herbs instead of table salt or sea salt. Check with your health care provider  or pharmacist before using salt substitutes.  Do not fry foods. Cook foods using healthy methods such as baking, boiling, grilling, and broiling instead.  Cook with heart-healthy oils, such as olive, canola, soybean, or sunflower oil. Meal planning  Eat a balanced diet that includes: ? 5 or more servings of fruits and vegetables each day. At each meal, try to fill half of your plate with fruits and vegetables. ? Up to 6-8 servings of whole grains each day. ? Less than 6 oz of lean meat, poultry, or fish each day. A 3-oz serving of meat is about the same size as a deck of cards. One egg equals 1 oz. ? 2 servings of low-fat dairy each day. ? A serving of nuts, seeds, or beans 5 times each week. ? Heart-healthy fats. Healthy fats called Omega-3 fatty acids are found in foods such as flaxseeds and coldwater fish, like sardines, salmon, and mackerel.  Limit how much you eat of the following: ? Canned or prepackaged foods. ? Food that is high in trans fat, such as fried foods. ? Food that is high in saturated fat, such as fatty meat. ? Sweets, desserts, sugary drinks, and other foods with added sugar. ? Full-fat dairy products.  Do not salt foods before eating.  Try to eat at least 2 vegetarian meals each week.  Eat more home-cooked food and less restaurant, buffet, and fast food.  When eating at a restaurant, ask that your  food be prepared with less salt or no salt, if possible. What foods are recommended? The items listed may not be a complete list. Talk with your dietitian about what dietary choices are best for you. Grains Whole-grain or whole-wheat bread. Whole-grain or whole-wheat pasta. Brown rice. [place]. Bulgur. Whole-grain and low-sodium cereals. Pita bread. Low-fat, low-sodium crackers. Whole-wheat flour tortillas. Vegetables Fresh or frozen vegetables (raw, steamed, roasted, or grilled). Low-sodium or reduced-sodium tomato and vegetable juice. Low-sodium or  reduced-sodium tomato sauce and tomato paste. Low-sodium or reduced-sodium canned vegetables. Fruits All fresh, dried, or frozen fruit. Canned fruit in natural juice (without added sugar). Meat and other protein foods Skinless chicken or [place]. Ground chicken or [place]. Pork with fat trimmed off. Fish and seafood. Egg whites. Dried beans, peas, or lentils. Unsalted nuts, nut butters, and seeds. Unsalted canned beans. Lean cuts of beef with fat trimmed off. Low-sodium, lean deli meat. Dairy Low-fat (1%) or fat-free (skim) milk. Fat-free, low-fat, or reduced-fat cheeses. Nonfat, low-sodium ricotta or cottage cheese. Low-fat or nonfat yogurt. Low-fat, low-sodium cheese. Fats and oils Soft margarine without trans fats. Vegetable oil. Low-fat, reduced-fat, or light mayonnaise and salad dressings (reduced-sodium). Canola, safflower, olive, soybean, and sunflower oils. Avocado. Seasoning and other foods Herbs. Spices. Seasoning mixes without salt. Unsalted popcorn and pretzels. Fat-free sweets. What foods are not recommended? The items listed may not be a complete list. Talk with your dietitian about what dietary choices are best for you. Grains Baked goods made with fat, such as croissants, muffins, or some breads. Dry pasta or rice meal packs. Vegetables Creamed or fried vegetables. Vegetables in a cheese sauce. Regular canned vegetables (not low-sodium or reduced-sodium). Regular canned tomato sauce and paste (not low-sodium or reduced-sodium). Regular tomato and vegetable juice (not low-sodium or reduced-sodium). Angie Fava. Olives. Fruits Canned fruit in a light or heavy syrup. Fried fruit. Fruit in cream or butter sauce. Meat and other protein foods Fatty cuts of meat. Ribs. Fried meat. Berniece Salines. Sausage. Bologna and other processed lunch meats. Salami. Fatback. Hotdogs. Bratwurst. Salted nuts and seeds. Canned beans with added salt. Canned or smoked fish. Whole eggs or egg yolks. Chicken or [place]  with skin. Dairy Whole or 2% milk, cream, and half-and-half. Whole or full-fat cream cheese. Whole-fat or sweetened yogurt. Full-fat cheese. Nondairy creamers. Whipped toppings. Processed cheese and cheese spreads. Fats and oils Butter. Stick margarine. Lard. Shortening. Ghee. Bacon fat. Tropical oils, such as coconut, palm kernel, or palm oil. Seasoning and other foods Salted popcorn and pretzels. Onion salt, garlic salt, seasoned salt, table salt, and sea salt. Worcestershire sauce. Tartar sauce. Barbecue sauce. Teriyaki sauce. Soy sauce, including reduced-sodium. Steak sauce. Canned and packaged gravies. Fish sauce. Oyster sauce. Cocktail sauce. Horseradish that you find on the shelf. Ketchup. Mustard. Meat flavorings and tenderizers. Bouillon cubes. Hot sauce and Tabasco sauce. Premade or packaged marinades. Premade or packaged taco seasonings. Relishes. Regular salad dressings. Where to find more information:  National Heart, Lung, and [place]: https://wilson-eaton.com/  American Heart Association: www.heart.org Summary  The DASH eating plan is a healthy eating plan that has been shown to reduce high blood pressure (hypertension). It may also reduce your risk for type 2 diabetes, heart disease, and stroke.  With the DASH eating plan, you should limit salt (sodium) intake to 2,300 mg a day. If you have hypertension, you may need to reduce your sodium intake to 1,500 mg a day.  When on the DASH eating plan, aim to eat more fresh fruits and vegetables,  whole grains, lean proteins, low-fat dairy, and heart-healthy fats.  Work with your health care provider or diet and nutrition specialist (dietitian) to adjust your eating plan to your individual calorie needs. This information is not intended to replace advice given to you by your health care provider. Make sure you discuss any questions you have with your health care provider. Document Released: 05/23/2011 Document Revised: 05/16/2017  Document Reviewed: 05/27/2016 Elsevier Patient Education  2020 [employer].

## 2019-01-15 NOTE — Progress Notes (Signed)
Name: Judith Olson   MRN: [id number]    DOB: [birth year]   Date:01/15/2019       Progress Note  Subjective  Chief Complaint  Chief Complaint  Patient presents with  . Medication Refill    HPI  Hyperlipidemia Patient rx lovastatin 40mg  daily. Has not taken medication since she retired in December, has been eating healthier and cutting down on portion sizes, avoids fried foods and has baked and broiled foods. Eating vegetables every day.  Has atherosclerosis, taking 81mg  aspirin Denies myalgias   Lab Results  Component Value Date   CHOL 142 04/17/2018   HDL 43 (L) 04/17/2018   LDLCALC 84 04/17/2018   TRIG 73 04/17/2018   CHOLHDL 3.3 04/17/2018    Hypertension Patient is prescribed lisinopril 20mg  daily, chlorthalidone 25mg  daily and Kclor 80meq daily.  She stopped these in December as well. She is compliant with low-salt diet.  Denies chest pain, or lightheadedness.   BP Readings from Last 3 Encounters:  01/15/19 130/82  01/15/19 (!) 148/98  11/18/17 118/62   With her apartment she is not allowed to have anyone at her place that is not on her lease, requesting an accommodations for disability paperwork to be filled to allow her niece the come and check on her  Chronic back pain Improved with weight loss, take PRN OTC meds  PHQ2/9: Depression screen [facility] 2/9 01/15/2019 11/18/2017 08/14/2017 02/10/2017 11/15/2016  Decreased Interest 0 0 0 0 0  Down, Depressed, Hopeless 0 0 0 0 0  PHQ - 2 Score 0 0 0 0 0  Altered sleeping 0 - - - -  Tired, decreased energy 0 - - - -  Change in appetite 0 - - - -  Feeling bad or failure about yourself  0 - - - -  Trouble concentrating 0 - - - -  Moving slowly or fidgety/restless 0 - - - -  Suicidal thoughts 0 - - - -  PHQ-9 Score 0 - - - -  Difficult doing work/chores Not difficult at all - - - -     PHQ reviewed. Negative  Patient Active Problem List   Diagnosis Date Noted  . Abnormal thyroid function test 02/10/2017  . Essential  hypertension, benign 08/04/2016  . 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 Scoliosis of lumbar spine 06/06/2016  . Obesity 06/06/2016  . Other kyphosis of thoracic region 12/10/2015    Past Medical History:  Diagnosis Date  . 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 Essential hypertension, benign 08/04/2016  . Hyperlipidemia   . Hypertension   . Muscle spasms of both lower extremities   . Obesity 06/06/2016  . Scoliosis of lumbar spine 06/06/2016    Past Surgical History:  Procedure Laterality Date  . ABDOMINAL HYSTERECTOMY  2000    Social History   Tobacco Use  . Smoking status: Former Smoker    Types: Cigarettes  . Smokeless tobacco: Never Used  Substance Use Topics  . Alcohol use: Yes    Alcohol/week: 0.0 standard drinks    Comment: rarely     Current Outpatient Medications:  .  aspirin 81 MG tablet, Take 81 mg by mouth daily., Disp: , Rfl:  .  chlorthalidone (HYGROTON) 25 MG tablet, TAKE 1 TABLET BY MOUTH EVERY DAY (Patient not taking: Reported on 01/15/2019), Disp:  5 tablet, Rfl: 0 .  cholecalciferol (VITAMIN D) 1000 units tablet, Take 1,000 Units by mouth daily., Disp: , Rfl:  .  Cyanocobalamin (VITAMIN B 12 PO), Take by mouth., Disp: , Rfl:  .  latanoprost (XALATAN) 0.005 % ophthalmic solution, Place 1 drop into the right eye Nightly. , Disp: , Rfl: 5 .  lisinopril (PRINIVIL,ZESTRIL) 20 MG tablet, Take 1 tablet (20 mg total) by mouth daily. (Patient not taking: Reported on 01/15/2019), Disp: 90 tablet, Rfl: 2 .  lovastatin (MEVACOR) 40 MG tablet, TAKE 1 TABLET BY MOUTH EVERYDAY AT BEDTIME (Patient not taking: Reported on 01/15/2019), Disp: 90 tablet, Rfl: 0 .  MegaRed Omega-3 Krill Oil 500 MG CAPS, Take by mouth., Disp: , Rfl:  .  potassium chloride (KLOR-CON 10) 10 MEQ  tablet, Take 1 tablet (10 mEq total) by mouth daily. (Patient not taking: Reported on 01/15/2019), Disp: 5 tablet, Rfl: 0 .  vitamin C (ASCORBIC ACID) 500 MG tablet, Take 500 mg by mouth daily., Disp: , Rfl:  .  vitamin E 1000 UNIT capsule, Take 1,000 Units by mouth daily., Disp: , Rfl:   No Known Allergies  Review of Systems  Constitutional: Negative for chills, fever and malaise/fatigue.  Respiratory: Negative for cough and shortness of breath.   Cardiovascular: Negative for chest pain, palpitations and leg swelling.  Gastrointestinal: Negative for abdominal pain.  Musculoskeletal: Negative for joint pain and myalgias.  Skin: Negative for rash.  Neurological: Positive for headaches (mild intermittent resolve with OTC meds). Negative for dizziness.  Psychiatric/Behavioral: The patient is not nervous/anxious and does not have insomnia.       No other specific complaints in a complete review of systems (except as listed in HPI above).  Objective  Vitals:   01/15/19 1519  BP: 130/82  Pulse: 96  Resp: 14  Temp: (!) 96.9 F (36.1 C)  TempSrc: Temporal  SpO2: 95%  Weight: 180 lb 9.6 oz (81.9 kg)  Height: 5\' 2"  (1.575 m)     Body mass index is 33.03 kg/m.  Nursing Note and Vital Signs reviewed.  Physical Exam  Physical Exam   Constitutional: Patient appears well-developed and well-nourished. Obese. No distress.  HEENT: head atraumatic, normocephalic, conjunctive clear Cardiovascular: Normal rate,  No BLE edema. Pulmonary/Chest: Effort normall. No respiratory distress. Skin: no concerning rashes or bruising  Psychiatric: Patient has a normal mood and affect. behavior is normal. Judgment and thought content normal.   Hands off exam due to pandemic and no acute concerns.     No results found for this or any previous visit (from the past 48 hour(s)).  Assessment & Plan  1. Aorto-iliac atherosclerosis ([place]) Had stopped cholesterol meds earlier we will check levels  today and decide if she needs to restart medications LDL goal under 70. - Lipid Profile  2. Essential hypertension, benign Patient stopped taking her medications for over 7 months her blood pressure at this time is controlled, we will follow-up in 3 months to ensure well-controlled blood pressure and add medications back on as needed.  Encouraged continued weight loss  3. Spondylosis of lumbar region without myelopathy or radiculopathy Stable continue as needed OTC meds  4. Screening for breast cancer Ordered mammogram  5. Screening for osteoporosis Ordered dexa  6. Need for Streptococcus pneumoniae vaccination completed  7. Medication monitoring encounter - COMPLETE METABOLIC PANEL WITH GFR  Discussed with patient we are unable to fill out her accommodations for disability paperwork however we provided her with a no requesting accommodations  for family members to stop by throughout the week to check on her.  Discussed [place] guidelines and healthy living recommendations. Face-to-face time with patient was more than 25 minutes, >50% time spent counseling and coordination of care

## 2019-01-15 NOTE — Progress Notes (Signed)
Subjective:   Judith Olson is a 68 y.o. female who presents for an Initial Medicare Annual Wellness Visit.  Review of Systems      Cardiac Risk Factors include: advanced age (>54men, >34 women);dyslipidemia;hypertension;obesity (BMI >30kg/m2)     Objective:    Today's Vitals   01/15/19 1415  BP: (!) 148/98  Pulse: 96  Resp: 16  Temp: (!) 96.9 F (36.1 C)  TempSrc: Other (Comment)  SpO2: 95%  Weight: 180 lb 9.6 oz (81.9 kg)  Height: 5\' 2"  (1.575 m)   Body mass index is 33.03 kg/m.  Advanced Directives 01/15/2019 02/10/2017 11/15/2016 08/13/2016 07/16/2016 06/06/2016 05/15/2016  Does Patient Have a Medical Advance Directive? No No No No No No No  Would patient like information on creating a medical advance directive? Yes (MAU/Ambulatory/Procedural Areas - Information given) - - - Yes (MAU/Ambulatory/Procedural Areas - Information given) - No - Patient declined    Current Medications (verified) Outpatient Encounter Medications as of 01/15/2019  Medication Sig  . aspirin 81 MG tablet Take 81 mg by mouth daily.  . cholecalciferol (VITAMIN D) 1000 units tablet Take 1,000 Units by mouth daily.  . Cyanocobalamin (VITAMIN B 12 PO) Take by mouth.  [place] Omega-3 Krill Oil 500 MG CAPS Take by mouth.  . vitamin C (ASCORBIC ACID) 500 MG tablet Take 500 mg by mouth daily.  . vitamin E 1000 UNIT capsule Take 1,000 Units by mouth daily.  . chlorthalidone (HYGROTON) 25 MG tablet TAKE 1 TABLET BY MOUTH EVERY DAY (Patient not taking: Reported on 01/15/2019)  . latanoprost (XALATAN) 0.005 % ophthalmic solution Place 1 drop into the right eye Nightly.   [place] lisinopril (PRINIVIL,ZESTRIL) 20 MG tablet Take 1 tablet (20 mg total) by mouth daily. (Patient not taking: Reported on 01/15/2019)  . lovastatin (MEVACOR) 40 MG tablet TAKE 1 TABLET BY MOUTH EVERYDAY AT BEDTIME (Patient not taking: Reported on 01/15/2019)  . potassium chloride (KLOR-CON 10) 10 MEQ tablet Take 1 tablet (10 mEq total) by mouth  daily. (Patient not taking: Reported on 01/15/2019)  . [DISCONTINUED] Omega-3 Fatty Acids (FISH OIL) 1000 MG CAPS Take by mouth.   No facility-administered encounter medications on file as of 01/15/2019.     Allergies (verified) Patient has no known allergies.   History: Past Medical History:  Diagnosis Date  . 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 Essential hypertension, benign 08/04/2016  . Hyperlipidemia   . Hypertension   . Muscle spasms of both lower extremities   . Obesity 06/06/2016  . Scoliosis of lumbar spine 06/06/2016   Past Surgical History:  Procedure Laterality Date  . ABDOMINAL HYSTERECTOMY  2000   Family History  Problem Relation Age of Onset  . Cancer Father        porstate  . Cancer Sister        breast  . Breast cancer Sister   . Cancer Brother        unknown  . Hypertension Neg Hx    Social History   Socioeconomic History  . Marital status: Divorced    Spouse name: Not on file  . Number of children: 2  . Years of education: Not on file  . Highest education level: High school graduate  Occupational History  . Occupation: retired  Scientific laboratory technician  . Financial resource strain: Not hard at all  . Food insecurity  Worry: Never true    Inability: Never true  . Transportation needs    Medical: No    Non-medical: No  Tobacco Use  . Smoking status: Former Smoker    Types: Cigarettes  . Smokeless tobacco: Never Used  Substance and Sexual Activity  . Alcohol use: Yes    Alcohol/week: 0.0 standard drinks    Comment: rarely  . Drug use: No    Comment: tried marijuana in the past  . Sexual activity: Not Currently  Lifestyle  . Physical activity    Days per week: 3 days    Minutes per session: 20 min  . Stress: Not at all  Relationships  . Social connections    Talks on phone: More than three times a week    Gets  together: Three times a week    Attends religious service: Never    Active member of club or organization: No    Attends meetings of clubs or organizations: Never    Relationship status: Divorced  Other Topics Concern  . Not on file  Social History Narrative  . Not on file    Tobacco Counseling Counseling given: Not Answered   Clinical Intake:  Pre-visit preparation completed: Yes  Pain : No/denies pain     BMI - recorded: 33.03 Nutritional Status: BMI > 30  Obese Nutritional Risks: None Diabetes: No  How often do you need to have someone help you when you read instructions, pamphlets, or other written materials from your doctor or pharmacy?: 1 - Never  Interpreter Needed?: No  Information entered by :: Clemetine Marker LPN   Activities of Daily Living In your present state of health, do you have any difficulty performing the following activities: 01/15/2019  Hearing? N  Comment pt declines hearing aids  Vision? N  Difficulty concentrating or making decisions? N  Walking or climbing stairs? N  Dressing or bathing? N  Doing errands, shopping? N  Preparing Food and eating ? N  Using the Toilet? N  In the past six months, have you accidently leaked urine? N  Do you have problems with loss of bowel control? N  Managing your Medications? N  Managing your Finances? N  Housekeeping or managing your Housekeeping? N  Some recent data might be hidden     Immunizations and Health Maintenance Immunization History  Administered Date(s) Administered  . Influenza-Unspecified 03/17/2018  . Tdap 08/13/2016  . Zoster 11/14/2015   Health Maintenance Due  Topic Date Due  . MAMMOGRAM  01/02/2018  . DEXA SCAN  05/13/2018  . PNA vac Low Risk Adult (1 of 2 - PCV13) 05/13/2018    Patient Care Team: Lada, Satira Anis, MD as PCP - General (Family Medicine)  Indicate any recent Medical Services you may have received from other than [facility] providers in the past year (date may be  approximate).     Assessment:   This is a routine wellness examination for Judith Olson.  Hearing/Vision screen  Hearing Screening   125Hz  250Hz  500Hz  1000Hz  2000Hz  3000Hz  4000Hz  6000Hz  8000Hz   Right ear:           Left ear:           Comments: Pt denies hearing difficulty  Vision Screening Comments: Past due for eye exam, plans to establish care at [place] issues and exercise activities discussed: Current Exercise Habits: Home exercise routine, Type of exercise: calisthenics;walking, Time (Minutes): 20, Frequency (Times/Week): 3, Weekly Exercise (Minutes/Week): 60, Intensity: Moderate, Exercise limited  by: None identified  Goals    . Weight (lb) < 160 lb (72.6 kg)     Pt states she would like to lose weight over the next year to improve health and time with grandchildren       Depression Screen PHQ 2/9 Scores 01/15/2019 11/18/2017 08/14/2017 02/10/2017 11/15/2016 08/13/2016 06/06/2016  PHQ - 2 Score 0 0 0 0 0 0 0  PHQ- 9 Score 0 - - - - - -    Fall Risk Fall Risk  01/15/2019 11/18/2017 08/14/2017 02/10/2017 11/15/2016  Falls in the past year? 0 No No No No  Number falls in past yr: 0 - - - -  Injury with Fall? 0 - - - -  Follow up Falls prevention discussed - - - -    FALL RISK PREVENTION PERTAINING TO THE HOME:  Any stairs in or around the home? Yes  If so, do they handrails? Yes   Home free of loose throw rugs in walkways, pet beds, electrical cords, etc? Yes  Adequate lighting in your home to reduce risk of falls? Yes   ASSISTIVE DEVICES UTILIZED TO PREVENT FALLS:  Life alert? No  Use of a cane, walker or w/c? No  Grab bars in the bathroom? No  Shower chair or bench in shower? No  Elevated toilet seat or a handicapped toilet? No   DME ORDERS:  DME order needed?  No   TIMED UP AND GO:  Was the test performed? Yes .  Length of time to ambulate 10 feet: 5 sec.   GAIT:  Appearance of gait: Gait stead-fast and without the use of an assistive device.    Education: Fall risk prevention has been discussed.  Intervention(s) required? No   Cognitive Function:     6CIT Screen 01/15/2019  What Year? 0 points  What month? 0 points  What time? 0 points  Count back from 20 0 points  Months in reverse 0 points  Repeat phrase 2 points  Total Score 2    Screening Tests Health Maintenance  Topic Date Due  . MAMMOGRAM  01/02/2018  . DEXA SCAN  05/13/2018  . PNA vac Low Risk Adult (1 of 2 - PCV13) 05/13/2018  . INFLUENZA VACCINE  01/16/2019  . COLONOSCOPY  06/08/2023  . TETANUS/TDAP  08/13/2026  . Hepatitis C Screening  Completed  . HIV Screening  Completed  . PAP SMEAR-Modifier  Discontinued    Qualifies for Shingles Vaccine? Yes . Due for Shingrix. Education has been provided regarding the importance of this vaccine. Pt has been advised to call insurance company to determine out of pocket expense. Advised may also receive vaccine at local pharmacy or Health Dept. Verbalized acceptance and understanding.  Tdap: Up to date   Flu Vaccine: Up to date  Pneumococcal Vaccine: Due for Pneumococcal vaccine. Does the patient want to receive this vaccine today?  Yes . Education has been provided regarding the importance of this vaccine but still declined. Advised may receive this vaccine at local pharmacy or Health Dept. Aware to provide a copy of the vaccination record if obtained from local pharmacy or Health Dept. Verbalized acceptance and understanding.   Cancer Screenings:  Colorectal Screening: Completed 06/07/13. Repeat every 10 years;   Mammogram: Completed 01/02/17. Repeat every year. Ordered today. Pt provided with contact information and advised to call to schedule appt.   Bone Density: DUE -  Ordered today. Pt provided with contact information and advised to call to schedule appt.  Lung Cancer Screening: (Low Dose CT Chest recommended if Age 24-80 years, 30 pack-year currently smoking OR have quit w/in 15years.) does not qualify.    Additional Screening:  Hepatitis C Screening: does qualify; Completed 11/20/15  Vision Screening: Recommended annual ophthalmology exams for early detection of glaucoma and other disorders of the eye. Is the patient up to date with their annual eye exam?  No  Who is the provider or what is the name of the office in which the pt attends annual eye exams? Plans to establish care at [place]: Recommended annual dental exams for proper oral hygiene  Community Resource Referral:  CRR required this visit?  No      Plan:    I have personally reviewed and addressed the Medicare Annual Wellness questionnaire and have noted the following in the patient's chart:  A. Medical and social history B. Use of alcohol, tobacco or illicit drugs  C. Current medications and supplements D. Functional ability and status E.  Nutritional status F.  Physical activity G. Advance directives H. List of other physicians I.  Hospitalizations, surgeries, and ER visits in previous 12 months J.  [place] such as hearing and vision if needed, cognitive and depression L. Referrals and appointments   In addition, I have reviewed and discussed with patient certain preventive protocols, quality metrics, and best practice recommendations. A written personalized care plan for preventive services as well as general preventive health recommendations were provided to patient.   Signed,  Clemetine Marker, LPN Nurse Health Advisor   Nurse Notes: pt doing well and appreciative of visit today. Also seeing Suezanne Cheshire NP today.

## 2019-01-16 LAB — COMPLETE METABOLIC PANEL WITH GFR
AG Ratio: 1.3 (calc) (ref 1.0–2.5)
ALT: 8 U/L (ref 6–29)
AST: 14 U/L (ref 10–35)
Albumin: 4.3 g/dL (ref 3.6–5.1)
Alkaline phosphatase (APISO): 75 U/L (ref 37–153)
BUN: 10 mg/dL (ref 7–25)
CO2: 25 mmol/L (ref 20–32)
Calcium: 9.9 mg/dL (ref 8.6–10.4)
Chloride: 107 mmol/L (ref 98–110)
Creat: 0.92 mg/dL (ref 0.50–0.99)
GFR, Est African American: 76 mL/min/{1.73_m2} (ref >=60)
GFR, Est Non African American: 65 mL/min/{1.73_m2} (ref >=60)
Globulin: 3.2 g/dL (calc) (ref 1.9–3.7)
Glucose, Bld: 92 mg/dL (ref 65–99)
Potassium: 3.7 mmol/L (ref 3.5–5.3)
Sodium: 142 mmol/L (ref 135–146)
Total Bilirubin: 0.6 mg/dL (ref 0.2–1.2)
Total Protein: 7.5 g/dL (ref 6.1–8.1)

## 2019-01-16 LAB — LIPID PANEL
Cholesterol: 206 mg/dL — ABNORMAL HIGH (ref <200)
HDL: 40 mg/dL — ABNORMAL LOW (ref >=50)
LDL Cholesterol (Calc): 143 mg/dL (calc) — ABNORMAL HIGH
Non-HDL Cholesterol (Calc): 166 mg/dL (calc) — ABNORMAL HIGH (ref <130)
Total CHOL/HDL Ratio: 5.2 (calc) — ABNORMAL HIGH (ref <5.0)
Triglycerides: 114 mg/dL (ref <150)

## 2019-01-18 ENCOUNTER — Other Ambulatory Visit: Payer: Self-pay | Admitting: Nurse Practitioner

## 2019-01-18 DIAGNOSIS — I700 Atherosclerosis of aorta: Secondary | ICD-10-CM

## 2019-01-18 DIAGNOSIS — I708 Atherosclerosis of other arteries: Secondary | ICD-10-CM

## 2019-01-18 MED ORDER — ATORVASTATIN CALCIUM 10 MG PO TABS: 10.0000 mg | ORAL_TABLET | Freq: Every day | ORAL | 0 refills | Status: DC

## 2019-04-16 ENCOUNTER — Ambulatory Visit (INDEPENDENT_AMBULATORY_CARE_PROVIDER_SITE_OTHER): Payer: Medicare Other | Admitting: Family Medicine

## 2019-04-16 ENCOUNTER — Other Ambulatory Visit: Payer: Self-pay

## 2019-04-16 ENCOUNTER — Encounter: Payer: Self-pay | Admitting: Family Medicine

## 2019-04-16 DIAGNOSIS — I708 Atherosclerosis of other arteries: Secondary | ICD-10-CM

## 2019-04-16 DIAGNOSIS — I10 Essential (primary) hypertension: Secondary | ICD-10-CM | POA: Diagnosis not present

## 2019-04-16 DIAGNOSIS — E782 Mixed hyperlipidemia: Secondary | ICD-10-CM | POA: Diagnosis not present

## 2019-04-16 DIAGNOSIS — I700 Atherosclerosis of aorta: Secondary | ICD-10-CM | POA: Diagnosis not present

## 2019-04-16 DIAGNOSIS — Z23 Encounter for immunization: Secondary | ICD-10-CM | POA: Diagnosis not present

## 2019-04-16 NOTE — Progress Notes (Signed)
Name: Judith Olson   MRN: [id number]    DOB: Jul 19, 1950   Date:04/16/2019       Progress Note  Subjective:    Chief Complaint  Chief Complaint  Patient presents with   Follow-up   Hyperlipidemia   Medication Refill    I connected with  Judith Olson  on 04/16/19 at 10:20 AM EDT by a video enabled telemedicine application and verified that I am speaking with the correct person using two identifiers.  I discussed the limitations of evaluation and management by telemedicine and the availability of in person appointments. The patient expressed understanding and agreed to proceed. Staff also discussed with the patient that there may be a patient responsible charge related to this service. Patient Location: pts home Provider Location: [facility] Additional Individuals present: none  HPI  Judith Olson is a 68 y.o. AA female presents for f/up on chronic conditions via virtual visit  Hyperlipidemia: Current Medication Regimen:  lipitor 10 mg daily and supplements Last Lipids: Lab Results  Component Value Date   CHOL 206 (H) 01/15/2019   HDL 40 (L) 01/15/2019   LDLCALC 143 (H) 01/15/2019   TRIG 114 01/15/2019   CHOLHDL 5.2 (H) 01/15/2019   - Current Diet:  Very healthy, avoids fats, fried foods, working diligently on health, supplements/vitamins and getting some exercise - Denies: Chest pain, shortness of breath, myalgias. - Documented aortic atherosclerosis? Yes - Risk factors for atherosclerosis: hypercholesterolemia and hypertension  Is also on vitamin B12, Vit C, Vit D  Hypertension:  Pt diagnosed with HTN >10 years ago Currently with lifestyle, BP improved when she retired and her general stress decreased, so she stopped meds at that time, last checked was 3 months ago  No lightheadedness, hypotension, syncope. Blood pressure today - not able to check at home, she is not concerrned and has had no sx - will check when she comes by for labs BP Readings from Last 3  Encounters:  01/15/19 130/82  01/15/19 (!) 148/98  11/18/17 118/62   Pt denies CP, SOB, exertional sx, LE edema, palpitation, Ha's, visual disturbances Dietary efforts for BP healthy diet      Patient Active Problem List   Diagnosis Date Noted   Abnormal thyroid function test 02/10/2017   Essential hypertension, benign 08/04/2016   Aorto-iliac atherosclerosis ([place]) 06/06/2016   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   Scoliosis of lumbar spine 06/06/2016   Obesity 06/06/2016   Other kyphosis of thoracic region 12/10/2015    Past Surgical History:  Procedure Laterality Date   ABDOMINAL HYSTERECTOMY  2000    Family History  Problem Relation Age of Onset   Cancer Father        porstate   Cancer Sister        breast   Breast cancer Sister    Cancer Brother        unknown   Hypertension Neg Hx     Social History   Socioeconomic History   Marital status: Divorced    Spouse name: Not on file   Number of children: 2   Years of education: Not on file   Highest education level: High school graduate  Occupational History   Occupation: retired  Scientist, product/process development strain: Not hard at all   Food insecurity    Worry: Never true    Inability: Never true   Transportation needs    Medical: No    Non-medical: No  Tobacco Use  Smoking status: Former Smoker    Types: Cigarettes   Smokeless tobacco: Never Used  Substance and Sexual Activity   Alcohol use: Yes    Alcohol/week: 0.0 standard drinks    Comment: rarely   Drug use: No    Comment: tried marijuana in the past   Sexual activity: Not Currently  Lifestyle   Physical activity    Days per week: 3 days    Minutes per session: 20 min   Stress: Not at all  Relationships   Social connections    Talks on phone: More than three times a week    Gets together: Three times a week    Attends religious service: Never    Active member of club or  organization: No    Attends meetings of clubs or organizations: Never    Relationship status: Divorced   Intimate partner violence    Fear of current or ex partner: No    Emotionally abused: No    Physically abused: No    Forced sexual activity: No  Other Topics Concern   Not on file  Social History Narrative   Not on file     Current Outpatient Medications:    aspirin 81 MG tablet, Take 81 mg by mouth daily., Disp: , Rfl:    atorvastatin (LIPITOR) 10 MG tablet, Take 1 tablet (10 mg total) by mouth daily., Disp: 90 tablet, Rfl: 0   cholecalciferol (VITAMIN D) 1000 units tablet, Take 1,000 Units by mouth daily., Disp: , Rfl:    Cyanocobalamin (VITAMIN B 12 PO), Take by mouth., Disp: , Rfl:    MegaRed Omega-3 Krill Oil 500 MG CAPS, Take by mouth., Disp: , Rfl:    vitamin C (ASCORBIC ACID) 500 MG tablet, Take 500 mg by mouth daily., Disp: , Rfl:    vitamin E 1000 UNIT capsule, Take 1,000 Units by mouth daily., Disp: , Rfl:    latanoprost (XALATAN) 0.005 % ophthalmic solution, Place 1 drop into the right eye Nightly. , Disp: , Rfl: 5  No Known Allergies  I personally reviewed active problem list, medication list, allergies, family history, social history, health maintenance, notes from last encounter, lab results, imaging with the patient/caregiver today.  Review of Systems  Constitutional: Negative.  Negative for activity change, appetite change, chills, diaphoresis, fatigue, fever and unexpected weight change.  HENT: Negative.   Eyes: Negative.   Respiratory: Negative.  Negative for apnea, cough, choking, chest tightness, shortness of breath, wheezing and stridor.   Cardiovascular: Negative.  Negative for chest pain, palpitations and leg swelling.  Gastrointestinal: Negative.   Endocrine: Negative.  Negative for cold intolerance, heat intolerance, polydipsia, polyphagia and polyuria.  Genitourinary: Negative.   Musculoskeletal: Negative.  Negative for myalgias.  Skin:  Negative.   Allergic/Immunologic: Negative.   Neurological: Negative.  Negative for dizziness and headaches.  Hematological: Negative.   Psychiatric/Behavioral: Negative.   All other systems reviewed and are negative.     Objective:    Virtual encounter, vitals limited, only able to obtain the following There were no vitals filed for this visit. There is no height or weight on file to calculate BMI. Nursing Note and Vital Signs reviewed.  Physical Exam Vitals signs and nursing note reviewed.  Constitutional:      General: She is not in acute distress.    Appearance: She is well-developed. She is not ill-appearing, toxic-appearing or diaphoretic.  HENT:     Head: Normocephalic and atraumatic.     Nose: Nose normal.  Eyes:     General: No scleral icterus.       Right eye: No discharge.        Left eye: No discharge.     Conjunctiva/sclera: Conjunctivae normal.  Neck:     Trachea: No tracheal deviation.  Pulmonary:     Effort: Pulmonary effort is normal. No respiratory distress.     Breath sounds: No stridor.  Skin:    Coloration: Skin is not jaundiced or pale.     Findings: No rash.  Neurological:     Mental Status: She is alert.     Comments: Normal coordination and movement of UE and walking around during video encounter  Psychiatric:        Mood and Affect: Mood normal.        Speech: Speech normal.        Behavior: Behavior normal. Behavior is cooperative.     PE limited by telephone encounter  No results found for this or any previous visit (from the past 72 hour(s)).  PHQ2/9: Depression screen [facility] 2/9 04/16/2019 01/15/2019 11/18/2017 08/14/2017 02/10/2017  Decreased Interest 0 0 0 0 0  Down, Depressed, Hopeless 0 0 0 0 0  PHQ - 2 Score 0 0 0 0 0  Altered sleeping 0 0 - - -  Tired, decreased energy 0 0 - - -  Change in appetite 0 0 - - -  Feeling bad or failure about yourself  0 0 - - -  Trouble concentrating 0 0 - - -  Moving slowly or fidgety/restless 0 0  - - -  Suicidal thoughts 0 0 - - -  PHQ-9 Score 0 0 - - -  Difficult doing work/chores Not difficult at all Not difficult at all - - -   PHQ-2/9 Result is negative.  Reviewed today  Fall Risk: Fall Risk  04/16/2019 01/15/2019 11/18/2017 08/14/2017 02/10/2017  Falls in the past year? 0 0 No No No  Number falls in past yr: 0 0 - - -  Injury with Fall? 0 0 - - -  Follow up - Falls prevention discussed - - -     Assessment and Plan:     ICD-10-CM   1. Mixed hyperlipidemia  99991111 COMPLETE METABOLIC PANEL WITH GFR    Lipid panel   last labs very elevated, after a dose decrease of lipitor from 20 mg to 10 mg?  Will recheck and increase if LDL still high  2. Aorto-iliac atherosclerosis ([facility])  123XX123 COMPLETE METABOLIC PANEL WITH GFR   I70.8 Lipid panel   discussed management today, she has not claudication sx  3. Essential hypertension, benign  99991111 COMPLETE METABOLIC PANEL WITH GFR   controlled with lifestyle/diet changes, will need to check when she comes by for labs, not able to check at home, feels good, no sx of elevated BP  4. Need for influenza vaccination  Z23     Pt has past hx of hyperglycemia and abnormal thyroid labs but all labs from last year were normal, will do labs pertinent to conditions listed above and do not feel at this time that old conditions need to be rechecked.  Will get VS when pt comes for labs Will offer and encourage flu shot as well when she comes  I discussed the assessment and treatment plan with the patient. The patient was provided an opportunity to ask questions and all were answered. The patient agreed with the plan and demonstrated an understanding of the instructions.  The  patient was advised to call back or seek an in-person evaluation if the symptoms worsen or if the condition fails to improve as anticipated.  I provided 12 minutes of non-face-to-face time during this encounter.  Delsa Grana, PA-C 04/15/2010:08 AM

## 2019-04-19 ENCOUNTER — Other Ambulatory Visit: Payer: Self-pay

## 2019-04-19 DIAGNOSIS — I700 Atherosclerosis of aorta: Secondary | ICD-10-CM

## 2019-04-19 MED ORDER — ATORVASTATIN CALCIUM 10 MG PO TABS: 10.0000 mg | ORAL_TABLET | Freq: Every day | ORAL | 0 refills | Status: DC

## 2019-07-12 ENCOUNTER — Encounter: Payer: Self-pay | Admitting: Family Medicine

## 2019-07-12 ENCOUNTER — Other Ambulatory Visit: Payer: Self-pay

## 2019-07-12 ENCOUNTER — Ambulatory Visit (INDEPENDENT_AMBULATORY_CARE_PROVIDER_SITE_OTHER): Payer: Medicare Other | Admitting: Family Medicine

## 2019-07-12 VITALS — BP 156/100 | HR 85 | Temp 97.1°F | Resp 14 | Ht 62.0 in | Wt 162.7 lb

## 2019-07-12 DIAGNOSIS — R410 Disorientation, unspecified: Secondary | ICD-10-CM

## 2019-07-12 DIAGNOSIS — I708 Atherosclerosis of other arteries: Secondary | ICD-10-CM

## 2019-07-12 DIAGNOSIS — I10 Essential (primary) hypertension: Secondary | ICD-10-CM

## 2019-07-12 DIAGNOSIS — R413 Other amnesia: Secondary | ICD-10-CM | POA: Diagnosis not present

## 2019-07-12 DIAGNOSIS — E876 Hypokalemia: Secondary | ICD-10-CM

## 2019-07-12 DIAGNOSIS — E782 Mixed hyperlipidemia: Secondary | ICD-10-CM | POA: Diagnosis not present

## 2019-07-12 DIAGNOSIS — R4189 Other symptoms and signs involving cognitive functions and awareness: Secondary | ICD-10-CM | POA: Diagnosis not present

## 2019-07-12 DIAGNOSIS — R269 Unspecified abnormalities of gait and mobility: Secondary | ICD-10-CM

## 2019-07-12 DIAGNOSIS — I700 Atherosclerosis of aorta: Secondary | ICD-10-CM

## 2019-07-12 LAB — POCT URINALYSIS DIPSTICK
Bilirubin, UA: NEGATIVE
Blood, UA: NEGATIVE
Glucose, UA: NEGATIVE
Ketones, UA: NEGATIVE
Leukocytes, UA: NEGATIVE
Nitrite, UA: NEGATIVE
Odor: NORMAL
Protein, UA: POSITIVE — AB
Spec Grav, UA: 1.015 (ref 1.010–1.025)
Urobilinogen, UA: 0.2 E.U./dL
pH, UA: 6 (ref 5.0–8.0)

## 2019-07-12 MED ORDER — LOSARTAN POTASSIUM-HCTZ 50-12.5 MG PO TABS: 1.0000 | ORAL_TABLET | Freq: Every day | ORAL | 1 refills | Status: DC

## 2019-07-12 NOTE — Progress Notes (Deleted)
Patient ID: Judith Olson, female    DOB: Oct 23, 1950, 69 y.o.   MRN: [id number]  PCP: Delsa Grana, PA-C  Chief Complaint  Patient presents with  . Memory Loss    wakes up and doesnt know what the day is    Subjective:   Judith Olson is a 69 y.o. female, presents to clinic with CC of the following:  HPI   Stopped driving last year Stumbling - gradual mobility changes  Forgetting what day it is     Patient Active Problem List   Diagnosis Date Noted  . Abnormal thyroid function test 02/10/2017  . Essential hypertension, benign 08/04/2016  . Aorto-iliac atherosclerosis ([place]) 06/06/2016  . 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 Scoliosis of lumbar spine 06/06/2016  . Obesity 06/06/2016  . Other kyphosis of thoracic region 12/10/2015      Current Outpatient Medications:  .  aspirin 81 MG tablet, Take 81 mg by mouth daily., Disp: , Rfl:  .  atorvastatin (LIPITOR) 10 MG tablet, Take 1 tablet (10 mg total) by mouth daily., Disp: 90 tablet, Rfl: 0 .  cholecalciferol (VITAMIN D) 1000 units tablet, Take 1,000 Units by mouth daily., Disp: , Rfl:  .  Cyanocobalamin (VITAMIN B 12 PO), Take by mouth., Disp: , Rfl:  .  latanoprost (XALATAN) 0.005 % ophthalmic solution, Place 1 drop into the right eye Nightly. , Disp: , Rfl: 5 .  MegaRed Omega-3 Krill Oil 500 MG CAPS, Take by mouth., Disp: , Rfl:  .  vitamin C (ASCORBIC ACID) 500 MG tablet, Take 500 mg by mouth daily., Disp: , Rfl:  .  vitamin E 1000 UNIT capsule, Take 1,000 Units by mouth daily., Disp: , Rfl:    No Known Allergies   Family History  Problem Relation Age of Onset  . Cancer Father        porstate  . Cancer Sister        breast  . Breast cancer Sister   . Cancer Brother        unknown  . Hypertension Neg Hx      Social History   Socioeconomic History  . Marital status: Divorced    Spouse name: Not on file  . Number of children: 2  . Years of education: Not on file  .  Highest education level: High school graduate  Occupational History  . Occupation: retired  Tobacco Use  . Smoking status: Former Smoker    Types: Cigarettes  . Smokeless tobacco: Never Used  Substance and Sexual Activity  . Alcohol use: Yes    Alcohol/week: 0.0 standard drinks    Comment: rarely  . Drug use: No    Comment: tried marijuana in the past  . Sexual activity: Not Currently  Other Topics Concern  . Not on file  Social History Narrative  . Not on file   Social Determinants of Health   Financial Resource Strain: Low Risk   . Difficulty of Paying Living Expenses: Not hard at all  Food Insecurity: No Food Insecurity  . Worried About Charity fundraiser in the Last Year: Never true  . Ran Out of Food in the Last Year: Never true  Transportation Needs: No Transportation Needs  . Lack of Transportation (Medical): No  . Lack of Transportation (Non-Medical): No  Physical Activity: Insufficiently Active  . Days of Exercise per Week: 3 days  . Minutes of Exercise per Session: 20 min  Stress: No Stress Concern Present  .  Feeling of Stress : Not at all  Social Connections: Moderately Isolated  . Frequency of Communication with Friends and Family: More than three times a week  . Frequency of Social Gatherings with Friends and Family: Three times a week  . Attends Religious Services: Never  . Active Member of Clubs or Organizations: No  . Attends Archivist Meetings: Never  . Marital Status: Divorced  Human resources officer Violence: Not At Risk  . Fear of Current or Ex-Partner: No  . Emotionally Abused: No  . Physically Abused: No  . Sexually Abused: No    Chart Review Today: ***  Review of Systems     Objective:   Vitals:   07/12/19 1555  BP: (!) 160/104  Pulse: 85  Resp: 14  Temp: (!) 97.1 F (36.2 C)  SpO2: 98%  Weight: 162 lb 11.2 oz (73.8 kg)  Height: 5\' 2"  (1.575 m)    Body mass index is 29.76 kg/m.  Physical Exam   Results for orders  placed or performed in visit on 01/15/19  Lipid Profile  Result Value Ref Range   Cholesterol 206 (H) <200 mg/dL   HDL 40 (L) > OR = 50 mg/dL   Triglycerides 114 <150 mg/dL   LDL Cholesterol (Calc) 143 (H) mg/dL (calc)   Total CHOL/HDL Ratio 5.2 (H) <5.0 (calc)   Non-HDL Cholesterol (Calc) 166 (H) <130 mg/dL (calc)  COMPLETE METABOLIC PANEL WITH GFR  Result Value Ref Range   Glucose, Bld 92 65 - 99 mg/dL   BUN 10 7 - 25 mg/dL   Creat 0.92 0.50 - 0.99 mg/dL   GFR, Est Non African American 65 > OR = 60 mL/min/1.57m2   GFR, Est African American 76 > OR = 60 mL/min/1.25m2   BUN/Creatinine Ratio NOT APPLICABLE 6 - 22 (calc)   Sodium 142 135 - 146 mmol/L   Potassium 3.7 3.5 - 5.3 mmol/L   Chloride 107 98 - 110 mmol/L   CO2 25 20 - 32 mmol/L   Calcium 9.9 8.6 - 10.4 mg/dL   Total Protein 7.5 6.1 - 8.1 g/dL   Albumin 4.3 3.6 - 5.1 g/dL   Globulin 3.2 1.9 - 3.7 g/dL (calc)   AG Ratio 1.3 1.0 - 2.5 (calc)   Total Bilirubin 0.6 0.2 - 1.2 mg/dL   Alkaline phosphatase (APISO) 75 37 - 153 U/L   AST 14 10 - 35 U/L   ALT 8 6 - 29 U/L        Assessment & Plan:    No diagnosis found.      Delsa Grana, PA-C 07/12/19 4:04 PM

## 2019-07-12 NOTE — Progress Notes (Signed)
Patient ID: Judith Olson, female    DOB: February 05, 1951, 69 y.o.   MRN: [id number]  PCP: Delsa Grana, PA-C  Chief Complaint  Patient presents with  . Memory Loss    wakes up and doesnt know what the day is    Subjective:   Judith Olson is a 69 y.o. female, presents to clinic with CC of the following:  HPI   Pt presents with concern of memory loss, she notes having trouble with knowing what day it is.  On Monday her son got up and got ready to go to work and came into second by his mother and she said it was Sunday and it happened a few times with the past couple months.  Patient pretty much stays at home does not go anywhere and does not have any change in her routine based on weekdays or weekends.  She stopped driving a while ago because she had some vision changes that were very brief but she did not feel comfortable driving after that point.  She not had trouble getting lost going to places she is familiar with, she not having any short-term memory problems.  Doing cognitive function testing and memory testing here today patient states she is not very good with spelling or with math - this accounts for some of her score.  Last MWV was 12/2018,  Cognitive Function: 6CIT Screen 01/15/2019  What Year? 0 points  What month? 0 points  What time? 0 points  Count back from 20 0 points  Months in reverse 0 points  Repeat phrase 2 points  Total Score 2     01 /25/21 1619  Mini Mental Exam  1. Orientation to time ( max 5 points ) 5  2. Orientation to Place ( max 5 points ) 4  3. Registration ( max 3 points ) 3  4. Attention / Calculation ( max 5 points ) 0  5. Recall ( max 3 points ) 2  6. Language-name 2 objects ( max 2 points ) 2  7. Language- repeat ( max 1 point ) 1  8. Language- follow 3 step command ( max 3 points ) 3  9. Language- read and follow direction ( max 1 point )  1  10. Write a sentence ( max 1 point ) 1  11. Copy design ( max 1 point ) 1  Total Score (max 30  points ) 23  I did repeat some of the testing with her she was able to not do spelling of world or calculations with serial sevens from 100 but was able to count backwards with the month of the year without any difficulty her score is likely 28 out of 30  Stopped driving last year Stumbling - gradual mobility changes -she denies any focal weakness numbness vertigo but she says she is been getting more clumsy and she will occasionally trip   She had something funny happen with her eyes and vision prior to stopping driving, but she has difficulty describing it.  She denies vertico, HA, eye pain, dizziness sx.  No CP, SOB, palpitations near syncope  BP is elevated today, we are rechecking  She has been on and off statins prior to my meeting her, she states she believes she is on her statin right now.  Last visit was virtual, was my first encounter with her, was about 3 months ago, she never came in to get labs done.   Hyperlipidemia:  Current Medication Regimen: Atorvastatin 10 mg  Last Lipids: Lab Results  Component Value Date   CHOL 206 (H) 01/15/2019   HDL 40 (L) 01/15/2019   LDLCALC 143 (H) 01/15/2019   TRIG 114 01/15/2019   CHOLHDL 5.2 (H) 01/15/2019  The 10-year ASCVD risk score Mikey Bussing DC Jr., et al., 2013) is: 16.1%   Values used to calculate the score:     Age: 37 years     Sex: Female     Is Non-Hispanic African American: Yes     Diabetic: No     Tobacco smoker: No     Systolic Blood Pressure: [id number] mmHg     Is BP treated: Yes     HDL Cholesterol: 40 mg/dL     Total Cholesterol: 206 mg/dL  - Denies: Chest pain, shortness of breath, myalgias. - Documented aortic atherosclerosis? Yes - Risk factors for atherosclerosis: hypercholesterolemia, hypertension and smoking    Patient Active Problem List   Diagnosis Date Noted  . Abnormal thyroid function test 02/10/2017  . Essential hypertension, benign 08/04/2016  . Aorto-iliac atherosclerosis ([place]) 06/06/2016  . Degenerative  joint disease (DJD) of lumbar spine 06/06/2016  . Scoliosis of lumbar spine 06/06/2016  . Obesity 06/06/2016  . Other kyphosis of thoracic region 12/10/2015      Current Outpatient Medications:  .  aspirin 81 MG tablet, Take 81 mg by mouth daily., Disp: , Rfl:  .  atorvastatin (LIPITOR) 10 MG tablet, Take 1 tablet (10 mg total) by mouth daily., Disp: 90 tablet, Rfl: 0 .  cholecalciferol (VITAMIN D) 1000 units tablet, Take 1,000 Units by mouth daily., Disp: , Rfl:  .  Cyanocobalamin (VITAMIN B 12 PO), Take by mouth., Disp: , Rfl:  .  latanoprost (XALATAN) 0.005 % ophthalmic solution, Place 1 drop into the right eye Nightly. , Disp: , Rfl: 5 .  MegaRed Omega-3 Krill Oil 500 MG CAPS, Take by mouth., Disp: , Rfl:  .  vitamin C (ASCORBIC ACID) 500 MG tablet, Take 500 mg by mouth daily., Disp: , Rfl:  .  vitamin E 1000 UNIT capsule, Take 1,000 Units by mouth daily., Disp: , Rfl:    No Known Allergies   Family History  Problem Relation Age of Onset  . Cancer Father        porstate  . Cancer Sister        breast  . Breast cancer Sister   . Cancer Brother        unknown  . Hypertension Neg Hx      Social History   Socioeconomic History  . Marital status: Divorced    Spouse name: Not on file  . Number of children: 2  . Years of education: Not on file  . Highest education level: High school graduate  Occupational History  . Occupation: retired  Tobacco Use  . Smoking status: Former Smoker    Types: Cigarettes  . Smokeless tobacco: Never Used  Substance and Sexual Activity  . Alcohol use: Yes    Alcohol/week: 0.0 standard drinks    Comment: rarely  . Drug use: No    Comment: tried marijuana in the past  . Sexual activity: Not Currently  Other Topics Concern  . Not on file  Social History Narrative  . Not on file   Social Determinants of Health   Financial Resource Strain: Low Risk   . Difficulty of Paying Living Expenses: Not hard at all  Food Insecurity: No Food  Insecurity  . Worried About Charity fundraiser in the Last  Year: Never true  . Ran Out of Food in the Last Year: Never true  Transportation Needs: No Transportation Needs  . Lack of Transportation (Medical): No  . Lack of Transportation (Non-Medical): No  Physical Activity: Insufficiently Active  . Days of Exercise per Week: 3 days  . Minutes of Exercise per Session: 20 min  Stress: No Stress Concern Present  . Feeling of Stress : Not at all  Social Connections: Moderately Isolated  . Frequency of Communication with Friends and Family: More than three times a week  . Frequency of Social Gatherings with Friends and Family: Three times a week  . Attends Religious Services: Never  . Active Member of Clubs or Organizations: No  . Attends Archivist Meetings: Never  . Marital Status: Divorced  Human resources officer Violence: Not At Risk  . Fear of Current or Ex-Partner: No  . Emotionally Abused: No  . Physically Abused: No  . Sexually Abused: No    Chart Review Today: I personally reviewed active problem list, medication list, allergies, family history, social history, health maintenance, notes from last encounter, lab results, imaging with the patient/caregiver today.  Reviewed several last exams, OV, and notes since pt is fairly new to me, today the first meeting in person, last [place] and cognitive screenings done in the past.  Review of Systems  Constitutional: Negative.  Negative for activity change, appetite change, chills, diaphoresis, fatigue, fever and unexpected weight change.  HENT: Negative.   Eyes: Positive for visual disturbance (brief, months ago). Negative for discharge.  Respiratory: Negative.  Negative for cough, chest tightness and shortness of breath.   Cardiovascular: Negative.  Negative for chest pain, palpitations and leg swelling.  Gastrointestinal: Negative.  Negative for abdominal pain and blood in stool.  Endocrine: Negative.  Negative for cold intolerance,  heat intolerance, polydipsia, polyphagia and polyuria.  Genitourinary: Negative.  Negative for decreased urine volume, dysuria, hematuria and urgency.  Musculoskeletal: Negative.  Negative for arthralgias, gait problem, joint swelling and myalgias.  Skin: Negative.  Negative for color change, pallor and rash.  Allergic/Immunologic: Negative.   Neurological: Negative.  Negative for dizziness, tremors, syncope, facial asymmetry, speech difficulty, weakness, light-headedness, numbness and headaches.  Hematological: Negative.   Psychiatric/Behavioral: Positive for confusion and decreased concentration. Negative for agitation, behavioral problems, dysphoric mood, hallucinations, self-injury, sleep disturbance and suicidal ideas. The patient is not nervous/anxious and is not hyperactive.        Objective:   Vitals:   07/12/19 1555 07/12/19 1641  BP: (!) 160/104 (!) 156/100  Pulse: 85   Resp: 14   Temp: (!) 97.1 F (36.2 C)   SpO2: 98%   Weight: 162 lb 11.2 oz (73.8 kg)   Height: 5\' 2"  (1.575 m)     Body mass index is 29.76 kg/m.  Physical Exam Vitals and nursing note reviewed.  Constitutional:      General: She is not in acute distress.    Appearance: Normal appearance. She is well-developed. She is not ill-appearing, toxic-appearing or diaphoretic.     Interventions: Face mask in place.  HENT:     Head: Normocephalic and atraumatic.     Right Ear: External ear normal.     Left Ear: External ear normal.  Eyes:     General: Lids are normal. No scleral icterus.       Right eye: No discharge.        Left eye: No discharge.     Conjunctiva/sclera: Conjunctivae normal.  Neck:  Trachea: Phonation normal. No tracheal deviation.  Cardiovascular:     Rate and Rhythm: Normal rate and regular rhythm.     Pulses: Normal pulses.          Radial pulses are 2+ on the right side and 2+ on the left side.       Posterior tibial pulses are 2+ on the right side and 2+ on the left side.      Heart sounds: Normal heart sounds. No murmur. No friction rub. No gallop.   Pulmonary:     Effort: Pulmonary effort is normal. No respiratory distress.     Breath sounds: Normal breath sounds. No stridor. No wheezing, rhonchi or rales.  Chest:     Chest wall: No tenderness.  Abdominal:     General: Bowel sounds are normal. There is no distension.     Palpations: Abdomen is soft.     Tenderness: There is no abdominal tenderness. There is no guarding or rebound.  Musculoskeletal:        General: No deformity. Normal range of motion.     Cervical back: Normal range of motion and neck supple.     Right lower leg: No edema.     Left lower leg: No edema.  Lymphadenopathy:     Cervical: No cervical adenopathy.  Skin:    General: Skin is warm and dry.     Capillary Refill: Capillary refill takes less than 2 seconds.     Coloration: Skin is not jaundiced or pale.     Findings: No rash.  Neurological:     Mental Status: She is alert and oriented to person, place, and time.     Motor: No abnormal muscle tone.     Gait: Gait normal.     Comments: CRANIAL NERVES:   II: Pupils equal and reactive, no RAPD,   III, IV, VI: EOM intact, no gaze preference or deviation, no nystagmus.   V: normal sensation in V1, V2, and V3 segments bilaterally   VII: no asymmetry, no nasolabial fold flattening   VIII: normal hearing to speech   IX, X: normal palatal elevation, no uvular deviation   XI: 5/5 head turn and 5/5 shoulder shrug bilaterally   XII: midline tongue protrusion  MOTOR:  5/5 bilateral grip strength 5/5 strength dorsiflexion/plantarflexion b/l  SENSORY:  Normal to light touch Romberg absent  COORD: Normal finger to nose and heel to shin, no tremor, no dysmetria  STATION: normal stance, no truncal ataxia  GAIT: Normal; patient able to tip-toe, heel-walk.   Psychiatric:        Speech: Speech normal.        Behavior: Behavior normal.      Results for orders placed or performed in  visit on 01/15/19  Lipid Profile  Result Value Ref Range   Cholesterol 206 (H) <200 mg/dL   HDL 40 (L) > OR = 50 mg/dL   Triglycerides 114 <150 mg/dL   LDL Cholesterol (Calc) 143 (H) mg/dL (calc)   Total CHOL/HDL Ratio 5.2 (H) <5.0 (calc)   Non-HDL Cholesterol (Calc) 166 (H) <130 mg/dL (calc)  COMPLETE METABOLIC PANEL WITH GFR  Result Value Ref Range   Glucose, Bld 92 65 - 99 mg/dL   BUN 10 7 - 25 mg/dL   Creat 0.92 0.50 - 0.99 mg/dL   GFR, Est Non African American 65 > OR = 60 mL/min/1.30m2   GFR, Est African American 76 > OR = 60 mL/min/1.49m2   BUN/Creatinine Ratio NOT APPLICABLE  6 - 22 (calc)   Sodium 142 135 - 146 mmol/L   Potassium 3.7 3.5 - 5.3 mmol/L   Chloride 107 98 - 110 mmol/L   CO2 25 20 - 32 mmol/L   Calcium 9.9 8.6 - 10.4 mg/dL   Total Protein 7.5 6.1 - 8.1 g/dL   Albumin 4.3 3.6 - 5.1 g/dL   Globulin 3.2 1.9 - 3.7 g/dL (calc)   AG Ratio 1.3 1.0 - 2.5 (calc)   Total Bilirubin 0.6 0.2 - 1.2 mg/dL   Alkaline phosphatase (APISO) 75 37 - 153 U/L   AST 14 10 - 35 U/L   ALT 8 6 - 29 U/L        Assessment & Plan:      ICD-10-CM   1. Memory changes  R41.3 POCT urinalysis dipstick    Urine Culture    CBC with Differential/Platelet    COMPLETE METABOLIC PANEL WITH GFR    TSH    B12 and Folate Panel    Drugs of abuse screen w/o alc, rtn urine-sln   mild, forgetting what day it is, pt has no routine, does the same thing every day, not alarming in COVID pandemic times  2. Cognitive changes  R41.89 POCT urinalysis dipstick    Urine Culture    CBC with Differential/Platelet    COMPLETE METABOLIC PANEL WITH GFR    TSH    B12 and Folate Panel    Drugs of abuse screen w/o alc, rtn urine-sln   MMSE score of 23, some difficultly with calculation and spelling, likely falsely low - possible neuro referral for further eval  3. Confusion  R41.0 POCT urinalysis dipstick    Urine Culture    CBC with Differential/Platelet    COMPLETE METABOLIC PANEL WITH GFR    TSH     B12 and Folate Panel    Drugs of abuse screen w/o alc, rtn urine-sln   see above  4. Mixed hyperlipidemia  99991111 COMPLETE METABOLIC PANEL WITH GFR    Lipid panel   not sure if she is taking lipitor - last lipid panel very high, never came in for recheck.  Labs today  5. Aorto-iliac atherosclerosis ([facility])  [id number] COMPLETE METABOLIC PANEL WITH GFR   I70.8 Lipid panel  6. Essential hypertension, benign  99991111 COMPLETE METABOLIC PANEL WITH GFR    losartan-hydrochlorothiazide (HYZAAR) 50-12.5 MG tablet   elevated, no meds recently, start losartan-HCTZ and f/up in 2 weeks  7. Unspecified abnormalities of gait and mobility  R26.9    grossly normal neuro eval in office, pt states she is stumbling a lot for the past couple months, checking labs, urine, possible neuro/pt referral    Pending patient's work-up we can discuss monitoring here or referring to neurology.  She does have high ASCVD risk score and will likely need to change her to a higher intensity statin.   Patient does not drink alcohol and denies any illicit drug use, she is not having any difficulty with her gait, ambulation, and is alert and oriented x3 here today Could possibly have her go for a PT evaluation of her gait and mobility  Can address more at close f/up visit in 2 weeks.  Delsa Grana, PA-C 07/12/19 4:12 PM

## 2019-07-12 NOTE — Patient Instructions (Addendum)
We will call you with your labs  Your blood pressure is very high today - start the BP med Losartan-HCTZ - take once daily in the morning Follow up here in 2 weeks for BP recheck.   Hypertension, Adult Hypertension is another name for high blood pressure. High blood pressure forces your heart to work harder to pump blood. This can cause problems over time. There are two numbers in a blood pressure reading. There is a top number (systolic) over a bottom number (diastolic). It is best to have a blood pressure that is below 120/80. Healthy choices can help lower your blood pressure, or you may need medicine to help lower it. What are the causes? The cause of this condition is not known. Some conditions may be related to high blood pressure. What increases the risk?  Smoking.  Having type 2 diabetes mellitus, high cholesterol, or both.  Not getting enough exercise or physical activity.  Being overweight.  Having too much fat, sugar, calories, or salt (sodium) in your diet.  Drinking too much alcohol.  Having long-term (chronic) kidney disease.  Having a family history of high blood pressure.  Age. Risk increases with age.  Race. You may be at higher risk if you are African American.  Gender. Men are at higher risk than women before age 4. After age 22, women are at higher risk than men.  Having obstructive sleep apnea.  Stress. What are the signs or symptoms?  High blood pressure may not cause symptoms. Very high blood pressure (hypertensive crisis) may cause: ? Headache. ? Feelings of worry or nervousness (anxiety). ? Shortness of breath. ? Nosebleed. ? A feeling of being sick to your stomach (nausea). ? Throwing up (vomiting). ? Changes in how you see. ? Very bad chest pain. ? Seizures. How is this treated?  This condition is treated by making healthy lifestyle changes, such as: ? Eating healthy foods. ? Exercising more. ? Drinking less alcohol.  Your health  care provider may prescribe medicine if lifestyle changes are not enough to get your blood pressure under control, and if: ? Your top number is above 130. ? Your bottom number is above 80.  Your personal target blood pressure may vary. Follow these instructions at home: Eating and drinking   If told, follow the DASH eating plan. To follow this plan: ? Fill one half of your plate at each meal with fruits and vegetables. ? Fill one fourth of your plate at each meal with whole grains. Whole grains include whole-wheat pasta, brown rice, and whole-grain bread. ? Eat or drink low-fat dairy products, such as skim milk or low-fat yogurt. ? Fill one fourth of your plate at each meal with low-fat (lean) proteins. Low-fat proteins include fish, chicken without skin, eggs, beans, and tofu. ? Avoid fatty meat, cured and processed meat, or chicken with skin. ? Avoid pre-made or processed food.  Eat less than 1,500 mg of salt each day.  Do not drink alcohol if: ? Your doctor tells you not to drink. ? You are pregnant, may be pregnant, or are planning to become pregnant.  If you drink alcohol: ? Limit how much you use to:  0-1 drink a day for women.  0-2 drinks a day for men. ? Be aware of how much alcohol is in your drink. In the [place] one drink equals one 12 oz bottle of beer (355 mL), one 5 oz glass of wine (148 mL), or one 1 oz glass of hard  liquor (44 mL). Lifestyle   Work with your doctor to stay at a healthy weight or to lose weight. Ask your doctor what the best weight is for you.  Get at least 30 minutes of exercise most days of the week. This may include walking, swimming, or biking.  Get at least 30 minutes of exercise that strengthens your muscles (resistance exercise) at least 3 days a week. This may include lifting weights or doing Pilates.  Do not use any products that contain nicotine or tobacco, such as cigarettes, e-cigarettes, and chewing tobacco. If you need help quitting,  ask your doctor.  Check your blood pressure at home as told by your doctor.  Keep all follow-up visits as told by your doctor. This is important. Medicines  Take over-the-counter and prescription medicines only as told by your doctor. Follow directions carefully.  Do not skip doses of blood pressure medicine. The medicine does not work as well if you skip doses. Skipping doses also puts you at risk for problems.  Ask your doctor about side effects or reactions to medicines that you should watch for. Contact a doctor if you:  Think you are having a reaction to the medicine you are taking.  Have headaches that keep coming back (recurring).  Feel dizzy.  Have swelling in your ankles.  Have trouble with your vision. Get help right away if you:  Get a very bad headache.  Start to feel mixed up (confused).  Feel weak or numb.  Feel faint.  Have very bad pain in your: ? Chest. ? Belly (abdomen).  Throw up more than once.  Have trouble breathing. Summary  Hypertension is another name for high blood pressure.  High blood pressure forces your heart to work harder to pump blood.  For most people, a normal blood pressure is less than 120/80.  Making healthy choices can help lower blood pressure. If your blood pressure does not get lower with healthy choices, you may need to take medicine. This information is not intended to replace advice given to you by your health care provider. Make sure you discuss any questions you have with your health care provider. Document Revised: 02/11/2018 Document Reviewed: 02/11/2018 Elsevier Patient Education  [place].   Mild Neurocognitive Disorder Mild neurocognitive disorder, formerly known as mild cognitive impairment, is a disorder in which memory does not work as well as it should. This disorder may also cause problems with other mental functions, including thought, communication, behavior, and completion of tasks. These  problems can be noticed and measured, but they usually do not interfere with daily activities or the ability to live independently. Mild neurocognitive disorder typically develops after 67 years of age, but it can also develop at younger ages. It is not as serious as major neurocognitive disorder, formerly known as dementia, but it may be the first sign of it. Generally, symptoms of this condition get worse over time. In rare cases, symptoms can get better. What are the causes? This condition may be caused by:  Brain disorders like Alzheimer's disease, Parkinson's disease, and other conditions that gradually damage nerve cells (neurodegenerative conditions).  Diseases that affect blood vessels in the brain and result in small strokes.  Certain infections, such as HIV.  Traumatic brain injury.  Other medical conditions, such as brain tumors, underactive thyroid (hypothyroidism), and vitamin B12 deficiency.  Use of certain drugs or prescription medicines. What increases the risk? The following factors may make you more likely to develop this condition:  Being older than 51.  Being female.  Low education level.  Diabetes, high blood pressure, high cholesterol, and other conditions that increase the risk for blood vessel diseases.  Untreated or undertreated sleep apnea.  Having a certain type of gene that can be passed from parent to child (inherited).  Chronic health problems such as heart disease, lung disease, liver disease, kidney disease, or depression. What are the signs or symptoms? Symptoms of this condition include:  Difficulty remembering. You may: ? Forget names, phone numbers, or details of recent events. ? Forget social events and appointments. ? Repeatedly forget where you put your car keys or other items.  Difficulty thinking and solving problems. You may have trouble with complex tasks, such as: ? Paying bills. ? Driving in unfamiliar places.  Difficulty  communicating. You may have trouble: ? Finding the right word or naming an object. ? Forming a sentence that makes sense, or understanding what you read or hear.  Changes in your behavior or personality. When this happens, you may: ? Lose interest in the things that you used to enjoy. ? Withdraw from social situations. ? Get angry more easily than usual. ? Act before thinking. How is this diagnosed? This condition is diagnosed based on:  Your symptoms. Your health care provider may ask you and the people you spend time with, such as family and friends, about your symptoms.  Evaluation of mental functions (neuropsychological testing). Your health care provider may refer you to a neurologist or mental health specialist to evaluate your mental functions in detail. To identify the cause of your condition, your health care provider may:  Get a detailed medical history.  Ask about use of alcohol, drugs, and prescription medicines.  Do a physical exam.  Order blood tests and brain imaging exams. How is this treated? Mild neurocognitive disorder that is caused by medicine use, drug use, infection, or another medical condition may improve when the cause is treated, or when medicines or drugs are stopped. If this disorder has another cause, it generally does not improve and may get worse. In these cases, the goal of treatment is to help you manage the loss of mental function. Treatments in these cases include:  Medicine. Medicine mainly helps memory and behavior symptoms.  Talk therapy. Talk therapy provides education, emotional support, memory aids, and other ways of making up for problems with mental function.  Lifestyle changes, including: ? Getting regular exercise. ? Eating a healthy diet that includes omega-3 fatty acids. ? Challenging your thinking and memory skills. ? Having more social interaction. Follow these instructions at home: Eating and drinking   Drink enough fluid to  keep your urine pale yellow.  Eat a healthy diet that includes omega-3 fatty acids. These can be found in: ? Fish. ? Nuts. ? Leafy vegetables. ? Vegetable oils.  If you drink alcohol: ? Limit how much you use to:  0-1 drink a day for women.  0-2 drinks a day for men. ? Be aware of how much alcohol is in your drink. In the [place] one drink equals one 12 oz bottle of beer (355 mL), one 5 oz glass of wine (148 mL), or one 1 oz glass of hard liquor (44 mL). Lifestyle   Get regular exercise as told by your health care provider.  Do not use any products that contain nicotine or tobacco, such as cigarettes, e-cigarettes, and chewing tobacco. If you need help quitting, ask your health care provider.  Practice ways to manage  stress. If you need help managing stress, ask your health care provider.  Continue to have social interaction.  Keep your mind active with stimulating activities you enjoy, such as reading or playing games.  Make sure to get quality sleep. Follow these tips: ? Avoid napping during the day. ? Keep your sleeping area dark and cool. ? Avoid exercising during the few hours before you go to bed. ? Avoid caffeine products in the evening. General instructions  Take over-the-counter and prescription medicines only as told by your health care provider. Your health care provider may recommend that you avoid taking medicines that can affect thinking, such as pain medicines or sleep medicines.  Work with your health care provider to find out what you need help with and what your safety needs are.  Keep all follow-up visits as told by your health care provider. This is important. Where to find more information  [employer] on Aging: http://kim-miller.com/ Contact a health care provider if:  You have any new symptoms. Get help right away if:  You develop new confusion or your confusion gets worse.  You act in ways that place you or your family in  danger. Summary  Mild neurocognitive disorder is a disorder in which memory does not work as well as it should.  Mild neurocognitive disorder can have many causes. It may be the first stage of dementia.  To manage your condition, get regular exercise, keep your mind active, get quality sleep, and eat a healthy diet. This information is not intended to replace advice given to you by your health care provider. Make sure you discuss any questions you have with your health care provider. Document Revised: 01/04/2019 Document Reviewed: 01/04/2019 Elsevier Patient Education  [place].

## 2019-07-14 LAB — COMPLETE METABOLIC PANEL WITH GFR
AG Ratio: 1.3 (calc) (ref 1.0–2.5)
ALT: 8 U/L (ref 6–29)
AST: 11 U/L (ref 10–35)
Albumin: 4.3 g/dL (ref 3.6–5.1)
Alkaline phosphatase (APISO): 84 U/L (ref 37–153)
BUN: 8 mg/dL (ref 7–25)
CO2: 27 mmol/L (ref 20–32)
Calcium: 10 mg/dL (ref 8.6–10.4)
Chloride: 110 mmol/L (ref 98–110)
Creat: 0.8 mg/dL (ref 0.50–0.99)
GFR, Est African American: 89 mL/min/{1.73_m2} (ref >=60)
GFR, Est Non African American: 77 mL/min/{1.73_m2} (ref >=60)
Globulin: 3.2 g/dL (calc) (ref 1.9–3.7)
Glucose, Bld: 93 mg/dL (ref 65–99)
Potassium: 3.2 mmol/L — ABNORMAL LOW (ref 3.5–5.3)
Sodium: 144 mmol/L (ref 135–146)
Total Bilirubin: 0.7 mg/dL (ref 0.2–1.2)
Total Protein: 7.5 g/dL (ref 6.1–8.1)

## 2019-07-14 LAB — CBC WITH DIFFERENTIAL/PLATELET
Absolute Monocytes: 624 cells/uL (ref 200–950)
Basophils Absolute: 32 cells/uL (ref 0–200)
Basophils Relative: 0.5 %
Eosinophils Absolute: 63 cells/uL (ref 15–500)
Eosinophils Relative: 1 %
HCT: 41.7 % (ref 35.0–45.0)
Hemoglobin: 13.4 g/dL (ref 11.7–15.5)
Lymphs Abs: 3434 cells/uL (ref 850–3900)
MCH: 29.8 pg (ref 27.0–33.0)
MCHC: 32.1 g/dL (ref 32.0–36.0)
MCV: 92.9 fL (ref 80.0–100.0)
MPV: 12.9 fL — ABNORMAL HIGH (ref 7.5–12.5)
Monocytes Relative: 9.9 %
Neutro Abs: 2148 cells/uL (ref 1500–7800)
Neutrophils Relative %: 34.1 %
Platelets: 165 10*3/uL (ref 140–400)
RBC: 4.49 10*6/uL (ref 3.80–5.10)
RDW: 13.8 % (ref 11.0–15.0)
Total Lymphocyte: 54.5 %
WBC: 6.3 10*3/uL (ref 3.8–10.8)

## 2019-07-14 LAB — LIPID PANEL
Cholesterol: 159 mg/dL (ref <200)
HDL: 38 mg/dL — ABNORMAL LOW (ref >=50)
LDL Cholesterol (Calc): 104 mg/dL (calc) — ABNORMAL HIGH
Non-HDL Cholesterol (Calc): 121 mg/dL (calc) (ref <130)
Total CHOL/HDL Ratio: 4.2 (calc) (ref <5.0)
Triglycerides: 80 mg/dL (ref <150)

## 2019-07-14 LAB — PAIN MGMT, PROFILE 1 CONF W/O MM, U
Amphetamines: NEGATIVE ng/mL
Barbiturates: NEGATIVE ng/mL
Benzodiazepines: NEGATIVE ng/mL
Cocaine Metabolite: NEGATIVE ng/mL
Creatinine: 170.7 mg/dL
Marijuana Metabolite: 2523 ng/mL
Marijuana Metabolite: POSITIVE ng/mL
Methadone Metabolite: NEGATIVE ng/mL
Opiates: NEGATIVE ng/mL
Oxidant: NEGATIVE ug/mL
Oxycodone: NEGATIVE ng/mL
Phencyclidine: NEGATIVE ng/mL
pH: 6.5 (ref 4.5–9.0)

## 2019-07-14 LAB — URINE CULTURE
MICRO NUMBER:: 10077490
Result:: NO GROWTH
SPECIMEN QUALITY:: ADEQUATE

## 2019-07-14 LAB — TSH: TSH: 2.13 mIU/L (ref 0.40–4.50)

## 2019-07-14 LAB — B12 AND FOLATE PANEL
Folate: 10.4 ng/mL
Vitamin B-12: >2000 pg/mL — ABNORMAL HIGH (ref 200–1100)

## 2019-07-15 MED ORDER — POTASSIUM CHLORIDE CRYS ER 20 MEQ PO TBCR: 20.0000 meq | EXTENDED_RELEASE_TABLET | Freq: Every day | ORAL | 0 refills | Status: DC

## 2019-07-16 ENCOUNTER — Telehealth: Payer: Self-pay

## 2019-07-16 DIAGNOSIS — E876 Hypokalemia: Secondary | ICD-10-CM

## 2019-07-16 NOTE — Telephone Encounter (Signed)
-----   Message from Delsa Grana, [place] sent at 07/15/2019  3:04 PM EST ----- Patient's urine was negative, she had no anemia, her potassium was low I will send in a supplement for that need her to take for a few days and come in for repeat labs at the end of next week (BMP for hypokalemia) Her B12 was very high she is likely getting too much of her supplement there was no folate deficiency, thyroid was normal  If the patient is concerned we can send her to neurology for further evaluation, at this time with some mild forgetfulness of what day it is during Covid pandemic and with her screening scores I am not really alarmed, we can have her f/up here for her regular MWV which monitors memory and cognitive function and any changes we refer to neuro

## 2019-07-18 ENCOUNTER — Other Ambulatory Visit: Payer: Self-pay | Admitting: Family Medicine

## 2019-07-18 DIAGNOSIS — I700 Atherosclerosis of aorta: Secondary | ICD-10-CM

## 2019-08-04 ENCOUNTER — Other Ambulatory Visit: Payer: Self-pay | Admitting: Family Medicine

## 2019-08-04 DIAGNOSIS — I10 Essential (primary) hypertension: Secondary | ICD-10-CM

## 2019-08-07 ENCOUNTER — Telehealth: Payer: Self-pay | Admitting: Family Medicine

## 2019-08-07 DIAGNOSIS — E876 Hypokalemia: Secondary | ICD-10-CM

## 2019-08-09 ENCOUNTER — Other Ambulatory Visit: Payer: Self-pay

## 2019-08-09 ENCOUNTER — Ambulatory Visit: Payer: Medicare Other

## 2019-08-09 VITALS — BP 140/82

## 2019-08-09 DIAGNOSIS — I10 Essential (primary) hypertension: Secondary | ICD-10-CM

## 2019-08-09 NOTE — Progress Notes (Signed)
Pt here for 2 week f/u on BP since starting Losartan/HCTZ.  Patient denies any abnormal symptoms or side effects.  Bp today is 140/82.  Please advise if needing any changes

## 2019-08-09 NOTE — Progress Notes (Signed)
Needed OV for new HTN new med BP f/up - not a nurse visit - please schedule an OV in the next month

## 2019-08-09 NOTE — Progress Notes (Signed)
She needs labs/exam etc.

## 2019-08-10 NOTE — Telephone Encounter (Signed)
Pt is scheduled °

## 2019-08-20 ENCOUNTER — Other Ambulatory Visit: Payer: Self-pay

## 2019-08-20 ENCOUNTER — Encounter: Payer: Self-pay | Admitting: Family Medicine

## 2019-08-20 ENCOUNTER — Ambulatory Visit (INDEPENDENT_AMBULATORY_CARE_PROVIDER_SITE_OTHER): Payer: Medicare Other | Admitting: Family Medicine

## 2019-08-20 VITALS — BP 138/86 | HR 63 | Temp 97.7°F | Resp 14 | Ht 62.0 in | Wt 163.7 lb

## 2019-08-20 DIAGNOSIS — E876 Hypokalemia: Secondary | ICD-10-CM

## 2019-08-20 DIAGNOSIS — I10 Essential (primary) hypertension: Secondary | ICD-10-CM | POA: Diagnosis not present

## 2019-08-20 MED ORDER — LOSARTAN POTASSIUM-HCTZ 50-12.5 MG PO TABS: 1.0000 | ORAL_TABLET | Freq: Every day | ORAL | 3 refills | Status: DC

## 2019-08-20 NOTE — Progress Notes (Signed)
Name: Judith Olson   MRN: [id number]    DOB: 07-19-1950   Date:08/20/2019       Progress Note  Chief Complaint  Patient presents with  . Follow-up    labs  . Medication Refill  . Hypertension     Subjective:   Judith Olson is a 69 y.o. female, presents to clinic for routine follow up on the conditions listed above.  Here for BP recheck and labs She presented 07/12/2019 with concerns of memory, gait and cognitive changes - at that time BP high, she was taking statin but had stopped HTN meds on her own mid 2020 (around summer) because she states she was feeling more relaxed/less stressed after retiring and she felt her BP was high due to stress.  She did virtual enounter 04/16/2019 w/o any BP readings or labs, off meds, she followed up 07/12/2019 still off meds BP 156/100 pt was feeling well at that time, labs showed hypokalemia 3.2, she was started on losartan-HCTZ and potassium supplement and was instructed to come back for 2 week OV f/up.   She came and had the nurse recheck BP, and when I received VS, bp improving, but pt still needed to come in for OV for f/up.   She is here today for that. Hypertension:  Currently managed on losartan-HCTZ 50-12.5 Pt reports good med compliance and denies any SE.  No lightheadedness, hypotension, syncope. Blood pressure today is well controlled, much improved over the last couple month since getting back on meds BP Readings from Last 3 Encounters:  08/20/19 138/86  08/09/19 140/82  07/12/19 (!) 156/100  Pt denies CP, SOB, exertional sx, LE edema, palpitation, Ha's, visual disturbances - HA's resolved, she feels less confused, feels clear. Dietary efforts for BP  Avoids sodas, she goes walking, she tried to eat healthy  Taking lipitor 10 mg every evening no myalgias, claudication, HA's, CP, SOB - last lipids are suboptimal when checked about a month ago - will have her continue meds and diet/lifestyle efforts and f/up in 2 months when we can  recheck lipid panel.   Patient Active Problem List   Diagnosis Date Noted  . Abnormal thyroid function test 02/10/2017  . Essential hypertension, benign 08/04/2016  . Aorto-iliac atherosclerosis ([place]) 06/06/2016  . 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 Scoliosis of lumbar spine 06/06/2016  . Obesity 06/06/2016  . Other kyphosis of thoracic region 12/10/2015    Past Surgical History:  Procedure Laterality Date  . ABDOMINAL HYSTERECTOMY  2000    Family History  Problem Relation Age of Onset  . Cancer Father        porstate  . Cancer Sister        breast  . Breast cancer Sister   . Cancer Brother        unknown  . Hypertension Neg Hx     Social History   Tobacco Use  . Smoking status: Former Smoker    Types: Cigarettes  . Smokeless tobacco: Never Used  Substance Use Topics  . Alcohol use: Yes    Alcohol/week: 0.0 standard drinks    Comment: rarely  . Drug use: No    Comment: tried marijuana in the past      Current Outpatient Medications:  .  aspirin 81 MG tablet, Take 81 mg by mouth daily., Disp: , Rfl:  .  atorvastatin (LIPITOR) 10 MG tablet, Take 1 tablet (10 mg total) by mouth at bedtime., Disp: 90 tablet, Rfl: 1 .  cholecalciferol (VITAMIN D) 1000 units tablet, Take 1,000 Units by mouth daily., Disp: , Rfl:  .  Cyanocobalamin (VITAMIN B 12 PO), Take by mouth., Disp: , Rfl:  .  losartan-hydrochlorothiazide (HYZAAR) 50-12.5 MG tablet, Take 1 tablet by mouth daily., Disp: 30 tablet, Rfl: 1 .  MegaRed Omega-3 Krill Oil 500 MG CAPS, Take by mouth., Disp: , Rfl:  .  vitamin C (ASCORBIC ACID) 500 MG tablet, Take 500 mg by mouth daily., Disp: , Rfl:  .  vitamin E 1000 UNIT capsule, Take 1,000 Units by mouth daily., Disp: , Rfl:  .  latanoprost (XALATAN) 0.005 % ophthalmic solution, Place 1 drop into the right eye Nightly. , Disp: , Rfl: 5 .  potassium chloride SA (KLOR-CON) 20 MEQ tablet, Take 1 tablet (20 mEq total) by mouth daily for 7  days. hypokalemia, Disp: 30 tablet, Rfl: 0  No Known Allergies  Chart Review Today: I personally reviewed active problem list, medication list, allergies, family history, social history, health maintenance, notes from last encounter, lab results, imaging with the patient/caregiver today.  Review of Systems  10 Systems reviewed and are negative for acute change except as noted in the HPI.    Objective:    Vitals:   08/20/19 1410  BP: 138/86  Pulse: 63  Resp: 14  Temp: 97.7 F (36.5 C)  SpO2: 97%  Weight: 163 lb 11.2 oz (74.3 kg)  Height: 5\' 2"  (1.575 m)    Body mass index is 29.94 kg/m.  Physical Exam Vitals and nursing note reviewed.  Constitutional:      General: She is not in acute distress.    Appearance: Normal appearance. She is well-developed. She is not ill-appearing, toxic-appearing or diaphoretic.     Interventions: Face mask in place.  HENT:     Head: Normocephalic and atraumatic.     Right Ear: External ear normal.     Left Ear: External ear normal.     Mouth/Throat:     Mouth: Mucous membranes are moist.  Eyes:     General: Lids are normal. No scleral icterus.       Right eye: No discharge.        Left eye: No discharge.     Conjunctiva/sclera: Conjunctivae normal.  Neck:     Trachea: Phonation normal. No tracheal deviation.  Cardiovascular:     Rate and Rhythm: Normal rate and regular rhythm.     Pulses: Normal pulses.          Radial pulses are 2+ on the right side and 2+ on the left side.       Posterior tibial pulses are 2+ on the right side and 2+ on the left side.     Heart sounds: Normal heart sounds. No murmur. No friction rub. No gallop.   Pulmonary:     Effort: Pulmonary effort is normal. No respiratory distress.     Breath sounds: Normal breath sounds. No stridor. No wheezing, rhonchi or rales.  Chest:     Chest wall: No tenderness.  Abdominal:     General: Bowel sounds are normal. There is no distension.     Palpations: Abdomen is  soft.     Tenderness: There is no abdominal tenderness. There is no guarding or rebound.  Musculoskeletal:        General: No deformity. Normal range of motion.     Cervical back: Normal range of motion and neck supple.     Right lower leg: No edema.  Left lower leg: No edema.  Lymphadenopathy:     Cervical: No cervical adenopathy.  Skin:    General: Skin is warm and dry.     Capillary Refill: Capillary refill takes less than 2 seconds.     Coloration: Skin is not jaundiced or pale.     Findings: No rash.  Neurological:     Mental Status: She is alert and oriented to person, place, and time.     Motor: No abnormal muscle tone.     Gait: Gait normal.  Psychiatric:        Mood and Affect: Mood normal.        Speech: Speech normal.        Behavior: Behavior normal.        PHQ2/9: Depression screen [facility] 2/9 08/20/2019 07/12/2019 04/16/2019 01/15/2019 11/18/2017  Decreased Interest 0 0 0 0 0  Down, Depressed, Hopeless 0 0 0 0 0  PHQ - 2 Score 0 0 0 0 0  Altered sleeping 0 0 0 0 -  Tired, decreased energy 0 0 0 0 -  Change in appetite 0 0 0 0 -  Feeling bad or failure about yourself  0 0 0 0 -  Trouble concentrating 0 0 0 0 -  Moving slowly or fidgety/restless 0 0 0 0 -  Suicidal thoughts 0 0 0 0 -  PHQ-9 Score 0 0 0 0 -  Difficult doing work/chores Not difficult at all Not difficult at all Not difficult at all Not difficult at all -    phq 9 is neg  Fall Risk: Fall Risk  08/20/2019 07/12/2019 07/12/2019 04/16/2019 01/15/2019  Falls in the past year? 0 0 0 0 0  Number falls in past yr: 0 0 0 0 0  Injury with Fall? 0 0 0 0 0  Follow up - Falls evaluation completed - - Falls prevention discussed    Functional Status Survey: Is the patient deaf or have difficulty hearing?: No Does the patient have difficulty seeing, even when wearing glasses/contacts?: Yes Does the patient have difficulty concentrating, remembering, or making decisions?: No Does the patient have difficulty  walking or climbing stairs?: No Does the patient have difficulty dressing or bathing?: No Does the patient have difficulty doing errands alone such as visiting a doctor's office or shopping?: No   Assessment & Plan:     ICD-10-CM   1. Essential hypertension, benign  99991111 BASIC METABOLIC PANEL WITH GFR    losartan-hydrochlorothiazide (HYZAAR) 50-12.5 MG tablet   Pt started losartan-HCTZ, HTN improving, goal < 130/80, will continue to monitor, encouragec continued efforts to improve heart health, handout given  2. Hypokalemia  [id number] BASIC METABOLIC PANEL WITH GFR   potassium was 3.2, pt took Rx supplement - she did not follow instructions and did not f/up, encouraged her to hold, recheck labs     Return for (has f/up appt already in May- Keep it!).   Delsa Grana, PA-C 08/20/19 2:29 PM

## 2019-08-20 NOTE — Patient Instructions (Signed)
Hold the potassium pill for not until I get your labs  Continue your losartan-HCTZ pill in the morning for your blood pressure and your lipitor at bedtime for cholesterol and we'll see you in a few months!   Managing Your Hypertension Hypertension is commonly called high blood pressure. This is when the force of your blood pressing against the walls of your arteries is too strong. Arteries are blood vessels that carry blood from your heart throughout your body. Hypertension forces the heart to work harder to pump blood, and may cause the arteries to become narrow or stiff. Having untreated or uncontrolled hypertension can cause heart attack, stroke, kidney disease, and other problems. What are blood pressure readings? A blood pressure reading consists of a higher number over a lower number. Ideally, your blood pressure should be below 120/80. The first ("top") number is called the systolic pressure. It is a measure of the pressure in your arteries as your heart beats. The second ("bottom") number is called the diastolic pressure. It is a measure of the pressure in your arteries as the heart relaxes. What does my blood pressure reading mean? Blood pressure is classified into four stages. Based on your blood pressure reading, your health care provider may use the following stages to determine what type of treatment you need, if any. Systolic pressure and diastolic pressure are measured in a unit called mm Hg. Normal  Systolic pressure: below 123456.  Diastolic pressure: below 80. Elevated  Systolic pressure: [id number].  Diastolic pressure: below 80. Hypertension stage 1  Systolic pressure: 0000000.  Diastolic pressure: [id number]. Hypertension stage 2  Systolic pressure: [id number] or above.  Diastolic pressure: 90 or above. What health risks are associated with hypertension? Managing your hypertension is an important responsibility. Uncontrolled hypertension can lead to:  A heart attack.  A  stroke.  A weakened blood vessel (aneurysm).  Heart failure.  Kidney damage.  Eye damage.  Metabolic syndrome.  Memory and concentration problems. What changes can I make to manage my hypertension? Hypertension can be managed by making lifestyle changes and possibly by taking medicines. Your health care provider will help you make a plan to bring your blood pressure within a normal range. Eating and drinking   Eat a diet that is high in fiber and potassium, and low in salt (sodium), added sugar, and fat. An example eating plan is called the DASH (Dietary Approaches to Stop Hypertension) diet. To eat this way: ? Eat plenty of fresh fruits and vegetables. Try to fill half of your plate at each meal with fruits and vegetables. ? Eat whole grains, such as whole wheat pasta, brown rice, or whole grain bread. Fill about one quarter of your plate with whole grains. ? Eat low-fat diary products. ? Avoid fatty cuts of meat, processed or cured meats, and poultry with skin. Fill about one quarter of your plate with lean proteins such as fish, chicken without skin, beans, eggs, and tofu. ? Avoid premade and processed foods. These tend to be higher in sodium, added sugar, and fat.  Reduce your daily sodium intake. Most people with hypertension should eat less than 1,500 mg of sodium a day.  Limit alcohol intake to no more than 1 drink a day for nonpregnant women and 2 drinks a day for men. One drink equals 12 oz of beer, 5 oz of wine, or 1 oz of hard liquor. Lifestyle  Work with your health care provider to maintain a healthy body weight, or to lose weight.  Ask what an ideal weight is for you.  Get at least 30 minutes of exercise that causes your heart to beat faster (aerobic exercise) most days of the week. Activities may include walking, swimming, or biking.  Include exercise to strengthen your muscles (resistance exercise), such as weight lifting, as part of your weekly exercise routine. Try  to do these types of exercises for 30 minutes at least 3 days a week.  Do not use any products that contain nicotine or tobacco, such as cigarettes and e-cigarettes. If you need help quitting, ask your health care provider.  Control any long-term (chronic) conditions you have, such as high cholesterol or diabetes. Monitoring  Monitor your blood pressure at home as told by your health care provider. Your personal target blood pressure may vary depending on your medical conditions, your age, and other factors.  Have your blood pressure checked regularly, as often as told by your health care provider. Working with your health care provider  Review all the medicines you take with your health care provider because there may be side effects or interactions.  Talk with your health care provider about your diet, exercise habits, and other lifestyle factors that may be contributing to hypertension.  Visit your health care provider regularly. Your health care provider can help you create and adjust your plan for managing hypertension. Will I need medicine to control my blood pressure? Your health care provider may prescribe medicine if lifestyle changes are not enough to get your blood pressure under control, and if:  Your systolic blood pressure is 130 or higher.  Your diastolic blood pressure is 80 or higher. Take medicines only as told by your health care provider. Follow the directions carefully. Blood pressure medicines must be taken as prescribed. The medicine does not work as well when you skip doses. Skipping doses also puts you at risk for problems. Contact a health care provider if:  You think you are having a reaction to medicines you have taken.  You have repeated (recurrent) headaches.  You feel dizzy.  You have swelling in your ankles.  You have trouble with your vision. Get help right away if:  You develop a severe headache or confusion.  You have unusual weakness or  numbness, or you feel faint.  You have severe pain in your chest or abdomen.  You vomit repeatedly.  You have trouble breathing. Summary  Hypertension is when the force of blood pumping through your arteries is too strong. If this condition is not controlled, it may put you at risk for serious complications.  Your personal target blood pressure may vary depending on your medical conditions, your age, and other factors. For most people, a normal blood pressure is less than 120/80.  Hypertension is managed by lifestyle changes, medicines, or both. Lifestyle changes include weight loss, eating a healthy, low-sodium diet, exercising more, and limiting alcohol. This information is not intended to replace advice given to you by your health care provider. Make sure you discuss any questions you have with your health care provider. Document Revised: 09/25/2018 Document Reviewed: 05/01/2016 Elsevier Patient Education  [place].    High Cholesterol  High cholesterol is a condition in which the blood has high levels of a white, waxy, fat-like substance (cholesterol). The human body needs small amounts of cholesterol. The liver makes all the cholesterol that the body needs. Extra (excess) cholesterol comes from the food that we eat. Cholesterol is carried from the liver by the blood through  the blood vessels. If you have high cholesterol, deposits (plaques) may build up on the walls of your blood vessels (arteries). Plaques make the arteries narrower and stiffer. Cholesterol plaques increase your risk for heart attack and stroke. Work with your health care provider to keep your cholesterol levels in a healthy range. What increases the risk? This condition is more likely to develop in people who:  Eat foods that are high in animal fat (saturated fat) or cholesterol.  Are overweight.  Are not getting enough exercise.  Have a family history of high cholesterol. What are the signs or  symptoms? There are no symptoms of this condition. How is this diagnosed? This condition may be diagnosed from the results of a blood test.  If you are older than age 39, your health care provider may check your cholesterol every 4-6 years.  You may be checked more often if you already have high cholesterol or other risk factors for heart disease. The blood test for cholesterol measures:  "Bad" cholesterol (LDL cholesterol). This is the main type of cholesterol that causes heart disease. The desired level for LDL is less than 100.  "Good" cholesterol (HDL cholesterol). This type helps to protect against heart disease by cleaning the arteries and carrying the LDL away. The desired level for HDL is 60 or higher.  Triglycerides. These are fats that the body can store or burn for energy. The desired number for triglycerides is lower than 150.  Total cholesterol. This is a measure of the total amount of cholesterol in your blood, including LDL cholesterol, HDL cholesterol, and triglycerides. A healthy number is less than 200. How is this treated? This condition is treated with diet changes, lifestyle changes, and medicines. Diet changes  This may include eating more whole grains, fruits, vegetables, nuts, and fish.  This may also include cutting back on red meat and foods that have a lot of added sugar. Lifestyle changes  Changes may include getting at least 40 minutes of aerobic exercise 3 times a week. Aerobic exercises include walking, biking, and swimming. Aerobic exercise along with a healthy diet can help you maintain a healthy weight.  Changes may also include quitting smoking. Medicines  Medicines are usually given if diet and lifestyle changes have failed to reduce your cholesterol to healthy levels.  Your health care provider may prescribe a statin medicine. Statin medicines have been shown to reduce cholesterol, which can reduce the risk of heart disease. Follow these  instructions at home: Eating and drinking If told by your health care provider:  Eat chicken (without skin), fish, veal, shellfish, ground [place] breast, and round or loin cuts of red meat.  Do not eat fried foods or fatty meats, such as hot dogs and salami.  Eat plenty of fruits, such as apples.  Eat plenty of vegetables, such as broccoli, potatoes, and carrots.  Eat beans, peas, and lentils.  Eat grains such as barley, rice, couscous, and bulgur wheat.  Eat pasta without cream sauces.  Use skim or nonfat milk, and eat low-fat or nonfat yogurt and cheeses.  Do not eat or drink whole milk, cream, ice cream, egg yolks, or hard cheeses.  Do not eat stick margarine or tub margarines that contain trans fats (also called partially hydrogenated oils).  Do not eat saturated tropical oils, such as coconut oil and palm oil.  Do not eat cakes, cookies, crackers, or other baked goods that contain trans fats.  General instructions  Exercise as directed by your  health care provider. Increase your activity level with activities such as gardening, walking, and taking the stairs.  Take over-the-counter and prescription medicines only as told by your health care provider.  Do not use any products that contain nicotine or tobacco, such as cigarettes and e-cigarettes. If you need help quitting, ask your health care provider.  Keep all follow-up visits as told by your health care provider. This is important. Contact a health care provider if:  You are struggling to maintain a healthy diet or weight.  You need help to start on an exercise program.  You need help to stop smoking. Get help right away if:  You have chest pain.  You have trouble breathing. This information is not intended to replace advice given to you by your health care provider. Make sure you discuss any questions you have with your health care provider. Document Revised: 06/06/2017 Document Reviewed: 12/02/2015 Elsevier  Patient Education  [place].

## 2019-08-21 LAB — BASIC METABOLIC PANEL WITH GFR
BUN: 10 mg/dL (ref 7–25)
CO2: 27 mmol/L (ref 20–32)
Calcium: 9.9 mg/dL (ref 8.6–10.4)
Chloride: 107 mmol/L (ref 98–110)
Creat: 0.77 mg/dL (ref 0.50–0.99)
GFR, Est African American: 93 mL/min/{1.73_m2} (ref >=60)
GFR, Est Non African American: 80 mL/min/{1.73_m2} (ref >=60)
Glucose, Bld: 89 mg/dL (ref 65–99)
Potassium: 4.1 mmol/L (ref 3.5–5.3)
Sodium: 141 mmol/L (ref 135–146)

## 2019-10-07 ENCOUNTER — Other Ambulatory Visit: Payer: Self-pay | Admitting: Family Medicine

## 2019-10-18 ENCOUNTER — Encounter: Payer: Self-pay | Admitting: Family Medicine

## 2019-10-18 ENCOUNTER — Ambulatory Visit (INDEPENDENT_AMBULATORY_CARE_PROVIDER_SITE_OTHER): Payer: Medicare Other | Admitting: Family Medicine

## 2019-10-18 ENCOUNTER — Other Ambulatory Visit: Payer: Self-pay

## 2019-10-18 VITALS — BP 138/86 | HR 78 | Temp 97.8°F | Ht 62.0 in | Wt 176.0 lb

## 2019-10-18 DIAGNOSIS — Z6832 Body mass index (BMI) 32.0-32.9, adult: Secondary | ICD-10-CM

## 2019-10-18 DIAGNOSIS — R4189 Other symptoms and signs involving cognitive functions and awareness: Secondary | ICD-10-CM

## 2019-10-18 DIAGNOSIS — Z780 Asymptomatic menopausal state: Secondary | ICD-10-CM

## 2019-10-18 DIAGNOSIS — R269 Unspecified abnormalities of gait and mobility: Secondary | ICD-10-CM

## 2019-10-18 DIAGNOSIS — I708 Atherosclerosis of other arteries: Secondary | ICD-10-CM

## 2019-10-18 DIAGNOSIS — E66811 Obesity, class 1: Secondary | ICD-10-CM

## 2019-10-18 DIAGNOSIS — I10 Essential (primary) hypertension: Secondary | ICD-10-CM

## 2019-10-18 DIAGNOSIS — E782 Mixed hyperlipidemia: Secondary | ICD-10-CM | POA: Diagnosis not present

## 2019-10-18 DIAGNOSIS — Z13820 Encounter for screening for osteoporosis: Secondary | ICD-10-CM

## 2019-10-18 DIAGNOSIS — Z5181 Encounter for therapeutic drug level monitoring: Secondary | ICD-10-CM

## 2019-10-18 DIAGNOSIS — Z1231 Encounter for screening mammogram for malignant neoplasm of breast: Secondary | ICD-10-CM

## 2019-10-18 DIAGNOSIS — I700 Atherosclerosis of aorta: Secondary | ICD-10-CM

## 2019-10-18 DIAGNOSIS — E6609 Other obesity due to excess calories: Secondary | ICD-10-CM

## 2019-10-18 DIAGNOSIS — N289 Disorder of kidney and ureter, unspecified: Secondary | ICD-10-CM

## 2019-10-18 MED ORDER — ATORVASTATIN CALCIUM 10 MG PO TABS: 10.0000 mg | ORAL_TABLET | Freq: Every day | ORAL | 3 refills | Status: DC

## 2019-10-18 NOTE — Progress Notes (Signed)
Name: Judith Olson   MRN: [id number]    DOB: 07/08/1950   Date:10/18/2019       Progress Note  Chief Complaint  Patient presents with  . Follow-up  . Hyperlipidemia  . Hypertension     Subjective:   Judith Olson is a 69 y.o. female, presents to clinic for routine follow up on the conditions listed above.  Hyperlipidemia: Current Medication Regimen:  lipitor 10 mg takes every night, good compliance, no concerns with meds or SE Last Lipids: Lab Results  Component Value Date   CHOL 159 07/12/2019   HDL 38 (L) 07/12/2019   LDLCALC 104 (H) 07/12/2019   TRIG 80 07/12/2019   CHOLHDL 4.2 07/12/2019  - Current Diet:  See below - Lifestyle:  Going to the gym with children, doing treadmill, bike - Denies: Chest pain, shortness of breath, myalgias. - Documented aortic atherosclerosis? Yes - Risk factors for atherosclerosis: hypercholesterolemia and hypertension   She is working out, cutting out starch/carbs, she has stopped frying her foods, only bakes or boils, avoids pork and beef She is working out and going to couple different gyms with her kids.  She has been get out of the house more and staying active.  She has been compliant with her medications she also has a granddaughter who is a Marine scientist who she discusses her health with and who has encouraged her to do all his positive lifestyle efforts.  She previously presented with some decreased coordination and some decreased cognition, blood pressure slightly higher and overall feeling forgetful and not well.  She has felt that her legs and mobility function both physically and mentally has improved significantly since working more diligently on her general health and wellness.  Hypertension:  Currently managed on losartan-HCTZ 50-12.5 daily Pt reports good med compliance and denies any SE.  No lightheadedness, hypotension, syncope. Blood pressure today is well controlled. BP Readings from Last 7 Encounters:  10/18/19 138/86   08/20/19 138/86  08/09/19 140/82  07/12/19 (!) 156/100  01/15/19 130/82  01/15/19 (!) 148/98  11/18/17 118/62  Pt denies CP, SOB, exertional sx, LE edema, palpitation, Ha's, visual disturbances Dietary efforts for BP?  See above  Aorto-iliac atherosclerosis -she is compliant with a statin and baby aspirin.  She is trying to do aerobic exercise and walking elliptical she has not had any claudication type symptoms she denies any pain in her leg muscles calves or buttock and denies any weakness with physical exertion.  Patient is overdue for bone density scan due postmenopausal estrogen deficiency, this is not been done as well as her mammogram -both were ordered last year in the summer, will reenter orders.  Patient states that her memory, cognition, mobility and gait are all improving with healthy lifestyle, exercising     Patient Active Problem List   Diagnosis Date Noted  . Mixed hyperlipidemia 10/18/2019  . Essential hypertension, benign 08/04/2016  . Aorto-iliac atherosclerosis ([place]) 06/06/2016  . 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 Scoliosis of lumbar spine 06/06/2016  . Obesity 06/06/2016  . Other kyphosis of thoracic region 12/10/2015    Past Surgical History:  Procedure Laterality Date  . ABDOMINAL HYSTERECTOMY  2000    Family History  Problem Relation Age of Onset  . Cancer Father        porstate  . Cancer Sister        breast  . Breast cancer Sister   . Cancer Brother  unknown  . Hypertension Neg Hx     Social History   Tobacco Use  . Smoking status: Former Smoker    Types: Cigarettes  . Smokeless tobacco: Never Used  Substance Use Topics  . Alcohol use: Yes    Alcohol/week: 0.0 standard drinks    Comment: rarely  . Drug use: No    Comment: tried marijuana in the past      Current Outpatient Medications:  .  aspirin 81 MG tablet, Take 81 mg by mouth daily., Disp: , Rfl:  .  atorvastatin (LIPITOR) 10 MG tablet,  Take 1 tablet (10 mg total) by mouth at bedtime., Disp: 90 tablet, Rfl: 3 .  cholecalciferol (VITAMIN D) 1000 units tablet, Take 1,000 Units by mouth daily., Disp: , Rfl:  .  Cyanocobalamin (VITAMIN B 12 PO), Take by mouth., Disp: , Rfl:  .  latanoprost (XALATAN) 0.005 % ophthalmic solution, Place 1 drop into the right eye Nightly. , Disp: , Rfl: 5 .  losartan-hydrochlorothiazide (HYZAAR) 50-12.5 MG tablet, Take 1 tablet by mouth daily., Disp: 90 tablet, Rfl: 3 .  MegaRed Omega-3 Krill Oil 500 MG CAPS, Take by mouth., Disp: , Rfl:  .  vitamin C (ASCORBIC ACID) 500 MG tablet, Take 500 mg by mouth daily., Disp: , Rfl:  .  vitamin E 1000 UNIT capsule, Take 1,000 Units by mouth daily., Disp: , Rfl:   No Known Allergies  Chart Review Today: I personally reviewed active problem list, medication list, allergies, family history, social history, health maintenance, notes from last encounter, lab results, imaging with the patient/caregiver today.   Review of Systems  10 Systems reviewed and are negative for acute change except as noted in the HPI.  Objective:    Vitals:   10/18/19 1401  BP: 138/86  Pulse: 78  Temp: 97.8 F (36.6 C)  SpO2: 98%  Weight: 176 lb (79.8 kg)  Height: 5\' 2"  (1.575 m)  HC: 14" (35.6 cm)    Body mass index is 32.19 kg/m.  Physical Exam Vitals and nursing note reviewed.  Constitutional:      General: She is not in acute distress.    Appearance: Normal appearance. She is well-developed and overweight. She is not ill-appearing, toxic-appearing or diaphoretic.     Interventions: Face mask in place.     Comments: Well-appearing female looks younger than stated age  HENT:     Head: Normocephalic and atraumatic.     Right Ear: External ear normal.     Left Ear: External ear normal.  Eyes:     General: Lids are normal. No scleral icterus.       Right eye: No discharge.        Left eye: No discharge.     Conjunctiva/sclera: Conjunctivae normal.  Neck:      Trachea: Phonation normal. No tracheal deviation.  Cardiovascular:     Rate and Rhythm: Normal rate and regular rhythm.     Pulses: Normal pulses.          Radial pulses are 2+ on the right side and 2+ on the left side.       Posterior tibial pulses are 2+ on the right side and 2+ on the left side.     Heart sounds: Normal heart sounds. No murmur. No friction rub. No gallop.   Pulmonary:     Effort: Pulmonary effort is normal. No respiratory distress.     Breath sounds: Normal breath sounds. No stridor. No wheezing, rhonchi or rales.  Chest:     Chest wall: No tenderness.  Abdominal:     General: Bowel sounds are normal. There is no distension.     Palpations: Abdomen is soft.  Musculoskeletal:        General: No deformity.     Right lower leg: No edema.     Left lower leg: No edema.  Skin:    General: Skin is warm and dry.     Capillary Refill: Capillary refill takes less than 2 seconds.     Coloration: Skin is not jaundiced or pale.     Findings: No rash.  Neurological:     Mental Status: She is alert.     Cranial Nerves: Facial asymmetry present. No dysarthria.     Sensory: Sensation is intact.     Motor: Motor function is intact. No abnormal muscle tone.     Coordination: Coordination is intact.     Gait: Gait is intact. Gait normal.  Psychiatric:        Attention and Perception: Attention normal.        Mood and Affect: Mood and affect normal.        Speech: Speech normal.        Behavior: Behavior normal. Behavior is cooperative.        Thought Content: Thought content does not include suicidal ideation.        Cognition and Memory: Cognition normal.        Judgment: Judgment normal.    PHQ2/9: Depression screen [facility] 2/9 10/18/2019 08/20/2019 07/12/2019 04/16/2019 01/15/2019  Decreased Interest 0 0 0 0 0  Down, Depressed, Hopeless 0 0 0 0 0  PHQ - 2 Score 0 0 0 0 0  Altered sleeping 0 0 0 0 0  Tired, decreased energy 0 0 0 0 0  Change in appetite 0 0 0 0 0  Feeling bad  or failure about yourself  0 0 0 0 0  Trouble concentrating 0 0 0 0 0  Moving slowly or fidgety/restless 0 0 0 0 0  Suicidal thoughts 0 0 0 0 0  PHQ-9 Score 0 0 0 0 0  Difficult doing work/chores Not difficult at all Not difficult at all Not difficult at all Not difficult at all Not difficult at all    phq 9 is negative, reviewed today.  Fall Risk: Fall Risk  10/18/2019 08/20/2019 07/12/2019 07/12/2019 04/16/2019  Falls in the past year? 0 0 0 0 0  Number falls in past yr: 0 0 0 0 0  Injury with Fall? 0 0 0 0 0  Follow up - - Falls evaluation completed - -    Functional Status Survey: Is the patient deaf or have difficulty hearing?: No Does the patient have difficulty seeing, even when wearing glasses/contacts?: No Does the patient have difficulty concentrating, remembering, or making decisions?: No Does the patient have difficulty walking or climbing stairs?: No Does the patient have difficulty dressing or bathing?: No Does the patient have difficulty doing errands alone such as visiting a doctor's office or shopping?: No   Assessment & Plan:     ICD-10-CM   1. Mixed hyperlipidemia  E78.2 Lipid panel    CBC with Differential/Platelet    COMPLETE METABOLIC PANEL WITH GFR    atorvastatin (LIPITOR) 10 MG tablet   Compliant with statin, due for lipid panel and CMP, no concerns or myalgias, no claudication symptoms  2. Essential hypertension, benign  [id number] COMPLETE METABOLIC PANEL WITH GFR   Blood  pressure stable and at goal for age, under 140/90, patient states her blood pressure is much better controlled now and steady  3. Aorto-iliac atherosclerosis ([facility])  I70.0 Lipid panel   [id number] COMPLETE METABOLIC PANEL WITH GFR    atorvastatin (LIPITOR) 10 MG tablet   Compliant with statin and taking baby aspirin, monitoring  4. Cognitive changes  R41.89    Improving with increased lifestyle efforts she does not feel as easily confused about date and time and her physical balance and strength is  improving as well  5. Unspecified abnormalities of gait and mobility  R26.9    See above-she had been concerned with stumbling and falling about 7 or 8 months ago but working on exercising and strengthening she is improve mobility and gait  6. Encounter for medication monitoring  Z51.81 Lipid panel    CBC with Differential/Platelet    COMPLETE METABOLIC PANEL WITH GFR   Recheck basic labs for all conditions and medications today  7. Postmenopausal estrogen deficiency  Z78.0 DG Bone Density   Due for DEXA scan  8. Renal insufficiency  [id number] COMPLETE METABOLIC PANEL WITH GFR   Monitor renal function patient has been managing her blood pressure, pushing fluids, encouraged to avoid NSAIDs  9. Screening for osteoporosis  [id number] COMPLETE METABOLIC PANEL WITH GFR   Due for DEXA due to postmenopausal estrogen deficiency she has not yet had her screening done since Medicare well visit, reorder DEXA today  10. Encounter for screening mammogram for malignant neoplasm of breast  Z12.31 MM 3D [place] for mammogram reentered order today  11. Class 1 obesity due to excess calories with body mass index (BMI) of 32.0 to 32.9 in adult, unspecified whether serious comorbidity present  E66.09 Lipid panel   Z68.32 CBC with Differential/Platelet    COMPLETE METABOLIC PANEL WITH GFR   With hypertension and hyperlipidemia, weight and BMI have been decreasing with improved and intensified health diet and lifestyle efforts  12. Medication monitoring encounter  Z51.81 Lipid panel    CBC with Differential/Platelet    COMPLETE METABOLIC PANEL WITH GFR     Return for 6 month routine f/up HTN HLD.   Delsa Grana, PA-C 10/18/19 5:29 PM

## 2019-10-19 LAB — COMPLETE METABOLIC PANEL WITH GFR
AG Ratio: 1.5 (calc) (ref 1.0–2.5)
ALT: 19 U/L (ref 6–29)
AST: 21 U/L (ref 10–35)
Albumin: 4.3 g/dL (ref 3.6–5.1)
Alkaline phosphatase (APISO): 84 U/L (ref 37–153)
BUN/Creatinine Ratio: 16 (calc) (ref 6–22)
BUN: 16 mg/dL (ref 7–25)
CO2: 31 mmol/L (ref 20–32)
Calcium: 10 mg/dL (ref 8.6–10.4)
Chloride: 104 mmol/L (ref 98–110)
Creat: 1 mg/dL — ABNORMAL HIGH (ref 0.50–0.99)
GFR, Est African American: 68 mL/min/{1.73_m2} (ref >=60)
GFR, Est Non African American: 59 mL/min/{1.73_m2} — ABNORMAL LOW (ref >=60)
Globulin: 2.9 g/dL (calc) (ref 1.9–3.7)
Glucose, Bld: 85 mg/dL (ref 65–99)
Potassium: 3.6 mmol/L (ref 3.5–5.3)
Sodium: 141 mmol/L (ref 135–146)
Total Bilirubin: 0.9 mg/dL (ref 0.2–1.2)
Total Protein: 7.2 g/dL (ref 6.1–8.1)

## 2019-10-19 LAB — CBC WITH DIFFERENTIAL/PLATELET
Absolute Monocytes: 554 cells/uL (ref 200–950)
Basophils Absolute: 28 cells/uL (ref 0–200)
Basophils Relative: 0.4 %
Eosinophils Absolute: 142 cells/uL (ref 15–500)
Eosinophils Relative: 2 %
HCT: 38.7 % (ref 35.0–45.0)
Hemoglobin: 12.3 g/dL (ref 11.7–15.5)
Lymphs Abs: 3387 cells/uL (ref 850–3900)
MCH: 28.9 pg (ref 27.0–33.0)
MCHC: 31.8 g/dL — ABNORMAL LOW (ref 32.0–36.0)
MCV: 90.8 fL (ref 80.0–100.0)
MPV: 10.9 fL (ref 7.5–12.5)
Monocytes Relative: 7.8 %
Neutro Abs: 2989 cells/uL (ref 1500–7800)
Neutrophils Relative %: 42.1 %
Platelets: 240 10*3/uL (ref 140–400)
RBC: 4.26 10*6/uL (ref 3.80–5.10)
RDW: 12 % (ref 11.0–15.0)
Total Lymphocyte: 47.7 %
WBC: 7.1 10*3/uL (ref 3.8–10.8)

## 2019-10-19 LAB — LIPID PANEL
Cholesterol: 158 mg/dL (ref <200)
HDL: 65 mg/dL (ref >=50)
LDL Cholesterol (Calc): 77 mg/dL (calc)
Non-HDL Cholesterol (Calc): 93 mg/dL (calc) (ref <130)
Total CHOL/HDL Ratio: 2.4 (calc) (ref <5.0)
Triglycerides: 80 mg/dL (ref <150)

## 2020-01-18 ENCOUNTER — Ambulatory Visit: Payer: Medicare Other

## 2020-01-27 ENCOUNTER — Ambulatory Visit: Payer: 59 | Admitting: Internal Medicine

## 2020-03-07 DIAGNOSIS — H40033 Anatomical narrow angle, bilateral: Secondary | ICD-10-CM | POA: Diagnosis not present

## 2020-03-08 NOTE — Progress Notes (Signed)
Name: Judith Olson   MRN: [id number]    DOB: [birth year]   Date:03/09/2020       Progress Note  Chief Complaint  Patient presents with  . Follow-up    lab work     Subjective:   Judith Olson is a 69 y.o. female, presents to clinic for routine f/up  Hyperlipidemia: Currently treated with Lipitor 10mg  qd and omega 3 supplement Last Lipids: Lab Results  Component Value Date   CHOL 158 10/18/2019   HDL 65 10/18/2019   LDLCALC 77 10/18/2019   TRIG 80 10/18/2019   CHOLHDL 2.4 10/18/2019   - Denies: Chest pain, shortness of breath, myalgias, claudication  Hypertension:  Currently managed on Losartan/HCTZ 50/12.5 mg Pt reports good med compliance and denies any SE.   Blood pressure today is well controlled. BP Readings from Last 3 Encounters:  03/09/20 128/82  10/18/19 138/86  08/20/19 138/86   Pt denies CP, SOB, exertional sx, LE edema, palpitation, Ha's, visual disturbances, lightheadedness, hypotension, syncope. Dietary efforts for BP?  Trying to be healthy  Cut out beef pork and is eating healthier and giong to the gym, eats a lot of seafood, spinach, brocolli, zucchini - moved in with son and they are all working on a healthy life, but she did gain some weight with moving and started working  Patient's kidney function declined slightly from March 2021 to May 2021 like to recheck today, her last cholesterol was well controlled in May.      Current Outpatient Medications:  .  aspirin 81 MG tablet, Take 81 mg by mouth daily., Disp: , Rfl:  .  atorvastatin (LIPITOR) 10 MG tablet, Take 1 tablet (10 mg total) by mouth at bedtime., Disp: 90 tablet, Rfl: 3 .  cholecalciferol (VITAMIN D) 1000 units tablet, Take 1,000 Units by mouth daily., Disp: , Rfl:  .  Cyanocobalamin (VITAMIN B 12 PO), Take by mouth., Disp: , Rfl:  .  latanoprost (XALATAN) 0.005 % ophthalmic solution, Place 1 drop into the right eye Nightly. , Disp: , Rfl: 5 .  losartan-hydrochlorothiazide (HYZAAR)  50-12.5 MG tablet, Take 1 tablet by mouth daily., Disp: 90 tablet, Rfl: 3 .  MegaRed Omega-3 Krill Oil 500 MG CAPS, Take by mouth., Disp: , Rfl:  .  vitamin C (ASCORBIC ACID) 500 MG tablet, Take 500 mg by mouth daily., Disp: , Rfl:  .  vitamin E 1000 UNIT capsule, Take 1,000 Units by mouth daily., Disp: , Rfl:   Patient Active Problem List   Diagnosis Date Noted  . Mixed hyperlipidemia 10/18/2019  . Essential hypertension, benign 08/04/2016  . Aorto-iliac atherosclerosis ([place]) 06/06/2016  . 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 Scoliosis of lumbar spine 06/06/2016  . Obesity 06/06/2016  . Other kyphosis of thoracic region 12/10/2015    Past Surgical History:  Procedure Laterality Date  . ABDOMINAL HYSTERECTOMY  2000    Family History  Problem Relation Age of Onset  . Cancer Father        porstate  . Cancer Sister        breast  . Breast cancer Sister   . Cancer Brother        unknown  . Hypertension Neg Hx     Social History   Tobacco Use  . Smoking status: Former Smoker    Types: Cigarettes  . Smokeless tobacco: Never Used  Vaping Use  . Vaping Use: Never used  Substance Use Topics  . Alcohol use: Yes  Alcohol/week: 0.0 standard drinks    Comment: rarely  . Drug use: No    Comment: tried marijuana in the past     No Known Allergies  Health Maintenance  Topic Date Due  . MAMMOGRAM  01/02/2018  . DEXA SCAN  Never done  . PNA vac Low Risk Adult (2 of 2 - PPSV23) 01/15/2020  . COLONOSCOPY  06/08/2023  . TETANUS/TDAP  08/13/2026  . INFLUENZA VACCINE  Completed  . COVID-19 Vaccine  Completed  . Hepatitis C Screening  Completed    Chart Review Today: I personally reviewed active problem list, medication list, allergies, family history, social history, health maintenance, notes from last encounter, lab results, imaging with the patient/caregiver today.   Review of Systems  10 Systems reviewed and are negative for acute change  except as noted in the HPI.  Objective:   Vitals:   03/09/20 1129  BP: 128/82  Pulse: 86  Resp: 16  Temp: 97.9 F (36.6 C)  TempSrc: Oral  SpO2: 95%  Weight: 195 lb 12.8 oz (88.8 kg)  Height: 5\' 2"  (1.575 m)    Body mass index is 35.81 kg/m.  Physical Exam Vitals and nursing note reviewed.  Constitutional:      General: She is not in acute distress.    Appearance: Normal appearance. She is well-developed. She is not ill-appearing, toxic-appearing or diaphoretic.     Interventions: Face mask in place.  HENT:     Head: Normocephalic and atraumatic.     Right Ear: External ear normal.     Left Ear: External ear normal.  Eyes:     General: Lids are normal. No scleral icterus.       Right eye: No discharge.        Left eye: No discharge.     Conjunctiva/sclera: Conjunctivae normal.  Neck:     Trachea: Phonation normal. No tracheal deviation.  Cardiovascular:     Rate and Rhythm: Normal rate and regular rhythm.     Pulses: Normal pulses.          Radial pulses are 2+ on the right side and 2+ on the left side.       Posterior tibial pulses are 2+ on the right side and 2+ on the left side.     Heart sounds: Normal heart sounds. No murmur heard.  No friction rub. No gallop.   Pulmonary:     Effort: Pulmonary effort is normal. No respiratory distress.     Breath sounds: Normal breath sounds. No stridor. No wheezing, rhonchi or rales.  Chest:     Chest wall: No tenderness.  Abdominal:     General: Bowel sounds are normal. There is no distension.     Palpations: Abdomen is soft.  Musculoskeletal:     Right lower leg: No edema.     Left lower leg: No edema.  Skin:    General: Skin is warm and dry.     Coloration: Skin is not jaundiced or pale.     Findings: No rash.  Neurological:     Mental Status: She is alert.     Motor: No abnormal muscle tone.     Gait: Gait normal.  Psychiatric:        Mood and Affect: Mood normal.        Speech: Speech normal.         Behavior: Behavior normal.         Assessment & Plan:     ICD-10-CM  1. Mixed hyperlipidemia  N98.9 COMPLETE METABOLIC PANEL WITH GFR   Patient compliant with statin, no myalgias fatigue or side effects, continue healthy diet and medications  2. Essential hypertension, benign  Q11 COMPLETE METABOLIC PANEL WITH GFR   Stable, well-controlled blood pressure at goal today continue healthy diet/lifestyle efforts  3. Function kidney decreased  N28.9    Slight decline in renal function earlier this year GFR went from 93-68 will recheck today  4. Medication monitoring encounter  H41.74 COMPLETE METABOLIC PANEL WITH GFR  5. AKI (acute kidney injury) ([place])  Y81.4 COMPLETE METABOLIC PANEL WITH GFR   Same as #3 encourage patient to continue to have blood pressure well controlled make sure she is hydrated avoid NSAIDs frequently or in high doses  6. Class 2 severe obesity with serious comorbidity and body mass index (BMI) of 35.0 to 35.9 in adult, unspecified obesity type ([place])  E66.01    Z68.35    working on diet and trying to loose weight     Return in about 6 months (around 09/06/2020) for Routine follow-up.   Delsa Grana, PA-C 03/09/20 11:42 AM

## 2020-03-09 ENCOUNTER — Other Ambulatory Visit: Payer: Self-pay

## 2020-03-09 ENCOUNTER — Ambulatory Visit (INDEPENDENT_AMBULATORY_CARE_PROVIDER_SITE_OTHER): Payer: Medicare Other | Admitting: Family Medicine

## 2020-03-09 ENCOUNTER — Encounter: Payer: Self-pay | Admitting: Family Medicine

## 2020-03-09 VITALS — BP 128/82 | HR 86 | Temp 97.9°F | Resp 16 | Ht 62.0 in | Wt 195.8 lb

## 2020-03-09 DIAGNOSIS — I10 Essential (primary) hypertension: Secondary | ICD-10-CM

## 2020-03-09 DIAGNOSIS — N289 Disorder of kidney and ureter, unspecified: Secondary | ICD-10-CM | POA: Diagnosis not present

## 2020-03-09 DIAGNOSIS — Z5181 Encounter for therapeutic drug level monitoring: Secondary | ICD-10-CM

## 2020-03-09 DIAGNOSIS — E782 Mixed hyperlipidemia: Secondary | ICD-10-CM | POA: Diagnosis not present

## 2020-03-09 DIAGNOSIS — Z6835 Body mass index (BMI) 35.0-35.9, adult: Secondary | ICD-10-CM

## 2020-03-09 DIAGNOSIS — N179 Acute kidney failure, unspecified: Secondary | ICD-10-CM

## 2020-03-09 LAB — COMPLETE METABOLIC PANEL WITH GFR
AG Ratio: 1.3 (calc) (ref 1.0–2.5)
ALT: 12 U/L (ref 6–29)
AST: 18 U/L (ref 10–35)
Albumin: 4 g/dL (ref 3.6–5.1)
Alkaline phosphatase (APISO): 79 U/L (ref 37–153)
BUN/Creatinine Ratio: 18 (calc) (ref 6–22)
BUN: 18 mg/dL (ref 7–25)
CO2: 29 mmol/L (ref 20–32)
Calcium: 9.6 mg/dL (ref 8.6–10.4)
Chloride: 106 mmol/L (ref 98–110)
Creat: 1.02 mg/dL — ABNORMAL HIGH (ref 0.50–0.99)
GFR, Est African American: 66 mL/min/{1.73_m2} (ref >=60)
GFR, Est Non African American: 57 mL/min/{1.73_m2} — ABNORMAL LOW (ref >=60)
Globulin: 3.1 g/dL (calc) (ref 1.9–3.7)
Glucose, Bld: 81 mg/dL (ref 65–99)
Potassium: 4.2 mmol/L (ref 3.5–5.3)
Sodium: 143 mmol/L (ref 135–146)
Total Bilirubin: 0.6 mg/dL (ref 0.2–1.2)
Total Protein: 7.1 g/dL (ref 6.1–8.1)

## 2020-04-05 DIAGNOSIS — H40033 Anatomical narrow angle, bilateral: Secondary | ICD-10-CM | POA: Diagnosis not present

## 2020-04-19 ENCOUNTER — Telehealth: Payer: Self-pay | Admitting: Family Medicine

## 2020-04-19 NOTE — Telephone Encounter (Signed)
Copied from [place] [id number]. Topic: Medicare AWV >> Apr 19, 2020  1:21 PM Cher Nakai R wrote: Reason for CRM: Left message for patient to call back and schedule the Medicare Annual Wellness Visit (AWV) in office or virtual  Last AWV  01/15/2019  Please schedule at anytime with [place].  40 minute appointment  Any questions, please contact me at [phone]

## 2020-04-20 ENCOUNTER — Other Ambulatory Visit: Payer: Self-pay

## 2020-04-20 ENCOUNTER — Encounter: Payer: Self-pay | Admitting: Family Medicine

## 2020-04-20 ENCOUNTER — Ambulatory Visit (INDEPENDENT_AMBULATORY_CARE_PROVIDER_SITE_OTHER): Payer: Medicare Other | Admitting: Family Medicine

## 2020-04-20 VITALS — BP 126/82 | HR 94 | Temp 98.0°F | Resp 16 | Ht 62.0 in | Wt 191.0 lb

## 2020-04-20 DIAGNOSIS — E782 Mixed hyperlipidemia: Secondary | ICD-10-CM | POA: Diagnosis not present

## 2020-04-20 DIAGNOSIS — Z23 Encounter for immunization: Secondary | ICD-10-CM

## 2020-04-20 DIAGNOSIS — I10 Essential (primary) hypertension: Secondary | ICD-10-CM | POA: Diagnosis not present

## 2020-04-20 DIAGNOSIS — Z6834 Body mass index (BMI) 34.0-34.9, adult: Secondary | ICD-10-CM

## 2020-04-20 DIAGNOSIS — I700 Atherosclerosis of aorta: Secondary | ICD-10-CM

## 2020-04-20 DIAGNOSIS — E669 Obesity, unspecified: Secondary | ICD-10-CM

## 2020-04-20 DIAGNOSIS — I708 Atherosclerosis of other arteries: Secondary | ICD-10-CM

## 2020-04-20 NOTE — Patient Instructions (Addendum)
Health Maintenance  Topic Date Due  . MAMMOGRAM  01/02/2018  . DEXA SCAN  Never done  . PNA vac Low Risk Adult (2 of 2 - PPSV23) 01/15/2020  . COLONOSCOPY  06/08/2023  . TETANUS/TDAP  08/13/2026  . INFLUENZA VACCINE  Completed  . COVID-19 Vaccine  Completed  . Hepatitis C Screening  Completed   [facility] [place],  [place]  [id number] Get Driving Directions Main: [phone]  Call and schedule bone scan and mammogram if you want to do together     Preventing Osteoporosis, Adult Osteoporosis is a condition that causes the bones to lose density. This means that the bones become thinner, and the normal spaces in bone tissue become larger. Low bone density can make the bones weak and cause them to break more easily. Osteoporosis cannot always be prevented, but you can take steps to lower your risk of developing this condition. How can this condition affect me? If you develop osteoporosis, you will be more likely to break bones in your wrist, spine, or hip. Even a minor accident or injury can be enough to break weak bones. The bones will also be slower to heal. Osteoporosis can cause other problems as well, such as a stooped posture or trouble with movement. Osteoporosis can occur with aging. As you get older, you may lose bone tissue more quickly, or it may be replaced more slowly. Osteoporosis is more likely to develop if you have poor nutrition or do not get enough calcium or vitamin D. Other lifestyle factors can also play a role. By eating a well-balanced diet and making lifestyle changes, you can help keep your bones strong and healthy, lowering your chances of developing osteoporosis. What can increase my risk? The following factors may make you more likely to develop osteoporosis:  Having a family history of the condition.  Having poor nutrition or not getting enough calcium or vitamin D.  Using certain medicines, such as  steroid medicines or antiseizure medicines.  Being any of the following: ? 55 years of age or older. ? Female. ? A woman who has gone through menopause (is postmenopausal). ? White (Caucasian) or of Asian descent.  Smoking or having a history of smoking.  Not being physically active (being sedentary).  Having a small body frame. What actions can I take to prevent this?  Get enough calcium   Make sure you get enough calcium every day. Calcium is the most important mineral for bone health. Most people can get enough calcium from their diet, but supplements may be recommended for people who are at risk for osteoporosis. Follow these guidelines: ? If you are age 82 or younger, aim to get 1,000 mg of calcium every day. ? If you are older than age 97, aim to get 1,200 mg of calcium every day.  Good sources of calcium include: ? Dairy products, such as low-fat or nonfat milk, cheese, and yogurt. ? Dark green leafy vegetables, such as bok choy and broccoli. ? Foods that have had calcium added to them (calcium-fortified foods), such as orange juice, cereal, bread, soy beverages, and tofu products. ? Nuts, such as almonds.  Check nutrition labels to see how much calcium is in a food or drink. Get enough vitamin D  Try to get enough vitamin D every day. Vitamin D is the most essential vitamin for bone health. It helps the body absorb calcium. Follow these guidelines for how much vitamin D to  get from food: ? If you are age 2 or younger, aim to get at least 600 international units (IU) every day. Your health care provider may suggest more. ? If you are older than age 59, aim to get at least 800 international units every day. Your health care provider may suggest more.  Good sources of vitamin D in your diet include: ? Egg yolks. ? Oily fish, such as salmon, sardines, and tuna. ? Milk and cereal fortified with vitamin D.  Your body also makes vitamin D when you are out in the sun.  Exposing the bare skin on your face, arms, legs, or back to the sun for no more than 30 minutes a day, 2 times a week is more than enough. Beyond that, make sure you use sunblock to protect your skin from sunburn, which increases your risk for skin cancer. Exercise  Stay active and get exercise every day.  Ask your health care provider what types of exercise are best for you. Weight-bearing and strength-building activities are important for building and maintaining healthy bones. Some examples of these types of activities include: ? Walking and hiking. ? Jogging and running. ? Dancing. ? Gym exercises. ? Lifting weights. ? Tennis and racquetball. ? Climbing stairs. ? Aerobics. Make other lifestyle changes  Do not use any products that contain nicotine or tobacco, such as cigarettes, e-cigarettes, and chewing tobacco. If you need help quitting, ask your health care provider.  Lose weight if you are overweight.  If you drink alcohol: ? Limit how much you use to:  0-1 drink a day for nonpregnant women.  0-2 drinks a day for men. ? Be aware of how much alcohol is in your drink. In the [place] one drink equals one 12 oz bottle of beer (355 mL), one 5 oz glass of wine (148 mL), or one 1 oz glass of hard liquor (44 mL). Where to find support If you need help making changes to prevent osteoporosis, talk with your health care provider. You can ask for a referral to a diet and nutrition specialist (dietitian) and a physical therapist. Where to find more information Learn more about osteoporosis from:  NIH Osteoporosis and Related [place]: www.bones.SouthExposed.es  U.S. Office on [employer] Health: [place]Signs.tn  [place]: EquipmentWeekly.com.ee Summary  Osteoporosis is a condition that causes weak bones that are more likely to break.  Eat a healthy diet, making sure you get enough calcium and vitamin D, and stay active by getting regular exercise  to help prevent osteoporosis.  Other ways to reduce your risk of osteoporosis include maintaining a healthy weight and avoiding alcohol and products that contain nicotine or tobacco. This information is not intended to replace advice given to you by your health care provider. Make sure you discuss any questions you have with your health care provider. Document Revised: 01/01/2019 Document Reviewed: 01/01/2019 Elsevier Patient Education  [place].

## 2020-04-20 NOTE — Progress Notes (Signed)
Name: Judith Olson   MRN: [id number]    DOB: 1950/12/31   Date:04/20/2020       Progress Note  Chief Complaint  Patient presents with  . Hypertension  . Hyperlipidemia     Subjective:   Judith Olson is a 69 y.o. female, presents to clinic for routine f/up  Due for mammogram - previously ordered Due for Dexa, previously ordered Supplementing with Vit D, not with calcium  HTN - well controlled, she is compliant with losartan-HCTZ 50-12.5 Last OV we addressed HTN but she was primarily asked to come in for f/up on change in renal function Pt does not use NSAIDs, she takes meds as prescribed, low salt diet No SE or concerns with meds Blood pressure today is well controlled. BP Readings from Last 3 Encounters:  04/20/20 126/82  03/09/20 128/82  10/18/19 138/86  Pt denies CP, SOB, exertional sx, LE edema, palpitation, Ha's, visual disturbances, lightheadedness, hypotension, syncope.  May visit renal function decreased, and sCr increased, repeated labs showed no improvement - but GFR remains >60 Renal function recent labs: Lab Results  Component Value Date   GFRAA 66 03/09/2020   GFRAA 68 10/18/2019   GFRAA 93 08/20/2019   Lab Results  Component Value Date   CREATININE 1.02 (H) 03/09/2020   BUN 18 03/09/2020   NA 143 03/09/2020   K 4.2 03/09/2020   CL 106 03/09/2020   CO2 29 03/09/2020   HLD - compliant with statin, still working on diet Lab Results  Component Value Date   CHOL 158 10/18/2019   HDL 65 10/18/2019   LDLCALC 77 10/18/2019   TRIG 80 10/18/2019   CHOLHDL 2.4 10/18/2019   Health Maintenance  Topic Date Due  . MAMMOGRAM  01/02/2018  . DEXA SCAN  Never done  . COLONOSCOPY  06/08/2023  . TETANUS/TDAP  08/13/2026  . INFLUENZA VACCINE  Completed  . COVID-19 Vaccine  Completed  . Hepatitis C Screening  Completed  . PNA vac Low Risk Adult  Completed   Due for PNA 23 today She did zoster in the past but not shingrix    Current Outpatient  Medications:  .  aspirin 81 MG tablet, Take 81 mg by mouth daily., Disp: , Rfl:  .  atorvastatin (LIPITOR) 10 MG tablet, Take 1 tablet (10 mg total) by mouth at bedtime., Disp: 90 tablet, Rfl: 3 .  cholecalciferol (VITAMIN D) 1000 units tablet, Take 1,000 Units by mouth daily., Disp: , Rfl:  .  Cyanocobalamin (VITAMIN B 12 PO), Take by mouth., Disp: , Rfl:  .  latanoprost (XALATAN) 0.005 % ophthalmic solution, Place 1 drop into the right eye Nightly. , Disp: , Rfl: 5 .  losartan-hydrochlorothiazide (HYZAAR) 50-12.5 MG tablet, Take 1 tablet by mouth daily., Disp: 90 tablet, Rfl: 3 .  MegaRed Omega-3 Krill Oil 500 MG CAPS, Take by mouth., Disp: , Rfl:  .  vitamin C (ASCORBIC ACID) 500 MG tablet, Take 500 mg by mouth daily., Disp: , Rfl:  .  vitamin E 1000 UNIT capsule, Take 1,000 Units by mouth daily., Disp: , Rfl:   Patient Active Problem List   Diagnosis Date Noted  . Mixed hyperlipidemia 10/18/2019  . Essential hypertension, benign 08/04/2016  . Aorto-iliac atherosclerosis ([place]) 06/06/2016  . 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 Scoliosis of lumbar spine 06/06/2016  . Obesity 06/06/2016  . Other kyphosis of thoracic region 12/10/2015    Past Surgical History:  Procedure Laterality Date  . ABDOMINAL  HYSTERECTOMY  2000    Family History  Problem Relation Age of Onset  . Cancer Father        porstate  . Cancer Sister        breast  . Breast cancer Sister   . Cancer Brother        unknown  . Hypertension Neg Hx     Social History   Tobacco Use  . Smoking status: Former Smoker    Types: Cigarettes  . Smokeless tobacco: Never Used  Vaping Use  . Vaping Use: Never used  Substance Use Topics  . Alcohol use: Yes    Alcohol/week: 0.0 standard drinks    Comment: rarely  . Drug use: No    Comment: tried marijuana in the past     No Known Allergies  Health Maintenance  Topic Date Due  . MAMMOGRAM  01/02/2018  . DEXA SCAN  Never done  . PNA  vac Low Risk Adult (2 of 2 - PPSV23) 01/15/2020  . COLONOSCOPY  06/08/2023  . TETANUS/TDAP  08/13/2026  . INFLUENZA VACCINE  Completed  . COVID-19 Vaccine  Completed  . Hepatitis C Screening  Completed    Chart Review Today: I personally reviewed active problem list, medication list, allergies, family history, social history, health maintenance, notes from last encounter, lab results, imaging with the patient/caregiver today.   Review of Systems  10 Systems reviewed and are negative for acute change except as noted in the HPI.  Objective:   Vitals:   04/20/20 1345  BP: 126/82  Pulse: 94  Resp: 16  Temp: 98 F (36.7 C)  TempSrc: Oral  SpO2: 96%  Weight: 191 lb (86.6 kg)  Height: 5\' 2"  (1.575 m)    Body mass index is 34.93 kg/m.  Physical Exam Vitals and nursing note reviewed.  Constitutional:      General: She is not in acute distress.    Appearance: Normal appearance. She is well-developed. She is obese. She is not ill-appearing, toxic-appearing or diaphoretic.     Interventions: Face mask in place.  HENT:     Head: Normocephalic and atraumatic.     Right Ear: External ear normal.     Left Ear: External ear normal.  Eyes:     General: Lids are normal. No scleral icterus.       Right eye: No discharge.        Left eye: No discharge.     Conjunctiva/sclera: Conjunctivae normal.  Neck:     Trachea: Phonation normal. No tracheal deviation.  Cardiovascular:     Rate and Rhythm: Normal rate and regular rhythm.     Pulses: Normal pulses.          Radial pulses are 2+ on the right side and 2+ on the left side.       Posterior tibial pulses are 2+ on the right side and 2+ on the left side.     Heart sounds: Normal heart sounds. No murmur heard.  No friction rub. No gallop.   Pulmonary:     Effort: Pulmonary effort is normal. No respiratory distress.     Breath sounds: Normal breath sounds. No stridor. No wheezing, rhonchi or rales.  Chest:     Chest wall: No  tenderness.  Abdominal:     General: Bowel sounds are normal. There is no distension.     Palpations: Abdomen is soft.  Musculoskeletal:     Right lower leg: No edema.     Left  lower leg: No edema.  Skin:    General: Skin is warm and dry.     Coloration: Skin is not jaundiced or pale.     Findings: No rash.  Neurological:     Mental Status: She is alert.     Motor: No abnormal muscle tone.     Gait: Gait normal.  Psychiatric:        Mood and Affect: Mood normal.        Speech: Speech normal.        Behavior: Behavior normal.         Assessment & Plan:     ICD-10-CM   1. Essential hypertension, benign  I10    stable, well controlled, continue monitoring renal function, encouraged good hydration, low salt, avoiding NSAIDs  2. Mixed hyperlipidemia  E78.2    good statin compliance, lipids due in 6 months  3. Aorto-iliac atherosclerosis ([facility])  I70.0    I70.8    compliant with statin, monitoring  4. Class 1 obesity with serious comorbidity and body mass index (BMI) of 34.0 to 34.9 in adult, unspecified obesity type  E66.9    Z68.34    weight has increased again, with HTN, HLD ad DDD  5. Need for pneumococcal vaccination  Z23 Pneumococcal polysaccharide vaccine 23-valent greater than or equal to 2yo subcutaneous/IM   Due for Dexa - given info about bone density scan, reviewed prevention of osteoporosis, hand out given Encouraged pt to take 1200 mg calcium daily and 1000 IU Vit D 3   Mammogram due - previously ordered, reviewed with pt  Vaccines up to date, shingrix discussed - can send to pharmacy if pt would like to get.  She has routine f/up in March 2022  Delsa Grana, [place] 04/20/20 2:05 PM

## 2020-05-03 DIAGNOSIS — H40033 Anatomical narrow angle, bilateral: Secondary | ICD-10-CM | POA: Diagnosis not present

## 2020-05-24 DIAGNOSIS — H40033 Anatomical narrow angle, bilateral: Secondary | ICD-10-CM | POA: Diagnosis not present

## 2020-05-24 DIAGNOSIS — H40031 Anatomical narrow angle, right eye: Secondary | ICD-10-CM | POA: Diagnosis not present

## 2020-06-15 ENCOUNTER — Telehealth: Payer: Self-pay | Admitting: Family Medicine

## 2020-06-15 NOTE — Telephone Encounter (Signed)
Copied from CRM [id number]. Topic: Medicare AWV >> Jun 15, 2020  2:11 PM Claudette Laws R wrote: Reason for CRM:  Left message for patient to call back and schedule Medicare Annual Wellness Visit (AWV) in office.   If not able to come in office, please offer to do virtually.   Last AWV 01/15/2019  Please schedule at anytime with [facility] Health Advisor.  40 minute appointment  Any questions, please contact me at [phone]

## 2020-06-16 ENCOUNTER — Other Ambulatory Visit: Payer: Self-pay | Admitting: Family Medicine

## 2020-06-16 DIAGNOSIS — I10 Essential (primary) hypertension: Secondary | ICD-10-CM

## 2020-06-21 ENCOUNTER — Other Ambulatory Visit: Payer: Commercial Managed Care - PPO

## 2020-06-27 ENCOUNTER — Telehealth: Payer: Self-pay | Admitting: Family Medicine

## 2020-06-27 NOTE — Telephone Encounter (Signed)
Copied from [place] [id number]. Topic: Medicare AWV >> Jun 27, 2020 10:16 AM Cher Nakai R wrote: Reason for CRM:   Left message for patient to call back and schedule Medicare Annual Wellness Visit (AWV) in office.   If not able to come in office, please offer to do virtually.   Last AWV 01/15/2019  Please schedule at anytime with [place].  40 minute appointment  Any questions, please contact me at [phone]

## 2020-06-28 ENCOUNTER — Ambulatory Visit
Admission: RE | Admit: 2020-06-28 | Discharge: 2020-06-28 | Disposition: A | Discharge status: Home / Self Care | Payer: Medicare Other | Source: Ambulatory Visit | Attending: Family Medicine | Admitting: Family Medicine

## 2020-06-28 ENCOUNTER — Other Ambulatory Visit: Payer: Self-pay

## 2020-06-28 DIAGNOSIS — Z1231 Encounter for screening mammogram for malignant neoplasm of breast: Secondary | ICD-10-CM | POA: Insufficient documentation

## 2020-06-28 DIAGNOSIS — Z780 Asymptomatic menopausal state: Secondary | ICD-10-CM | POA: Diagnosis not present

## 2020-06-28 DIAGNOSIS — M85851 Other specified disorders of bone density and structure, right thigh: Secondary | ICD-10-CM | POA: Diagnosis not present

## 2020-08-02 DIAGNOSIS — H2513 Age-related nuclear cataract, bilateral: Secondary | ICD-10-CM | POA: Diagnosis not present

## 2020-08-10 ENCOUNTER — Telehealth: Payer: Self-pay | Admitting: Family Medicine

## 2020-08-10 NOTE — Telephone Encounter (Signed)
Copied from [place] #[id number]. Topic: Medicare AWV >> Aug 10, 2020  3:04 PM Cher Nakai R wrote: Reason for CRM:   Spoke with patient.  She as to check with work then will call back to schedule Medicare Annual Wellness Visit (AWV) in office.   If unable to come into the office for AWV,  please offer to do virtually or by telephone.  Last AWV: 01/15/2019  Please schedule at anytime with [place].  40 minute appointment  Any questions, please contact me at [phone]

## 2020-08-11 NOTE — Telephone Encounter (Signed)
Pt is sch for 08-15-2020 at 8 am

## 2020-08-15 ENCOUNTER — Other Ambulatory Visit: Payer: Self-pay

## 2020-08-15 ENCOUNTER — Ambulatory Visit (INDEPENDENT_AMBULATORY_CARE_PROVIDER_SITE_OTHER): Payer: Medicare (Managed Care)

## 2020-08-15 VITALS — BP 128/86 | HR 68 | Temp 98.1°F | Resp 16 | Ht 62.0 in | Wt 214.5 lb

## 2020-08-15 DIAGNOSIS — Z0000 Encounter for general adult medical examination without abnormal findings: Secondary | ICD-10-CM | POA: Diagnosis not present

## 2020-08-15 NOTE — Progress Notes (Signed)
Subjective:   Judith Olson is a 70 y.o. female who presents for Medicare Annual (Subsequent) preventive examination.  Review of Systems     Cardiac Risk Factors include: advanced age (>80men, >14 women);dyslipidemia;hypertension;obesity (BMI >30kg/m2)     Objective:    Today's Vitals   08/15/20 0829  BP: 128/86  Pulse: 68  Resp: 16  Temp: 98.1 F (36.7 C)  TempSrc: Oral  Weight: 214 lb 8 oz (97.3 kg)  Height: 5\' 2"  (1.575 m)   Body mass index is 39.23 kg/m.  Advanced Directives 08/15/2020 01/15/2019 02/10/2017 11/15/2016 08/13/2016 07/16/2016 06/06/2016  Does Patient Have a Medical Advance Directive? No No No No No No No  Would patient like information on creating a medical advance directive? Yes (MAU/Ambulatory/Procedural Areas - Information given) Yes (MAU/Ambulatory/Procedural Areas - Information given) - - - Yes (MAU/Ambulatory/Procedural Areas - Information given) -    Current Medications (verified) Outpatient Encounter Medications as of 08/15/2020  Medication Sig  . aspirin 81 MG tablet Take 81 mg by mouth daily.  Marland Kitchen atorvastatin (LIPITOR) 10 MG tablet Take 1 tablet (10 mg total) by mouth at bedtime.  . calcium carbonate (OSCAL) 1500 (600 Ca) MG TABS tablet Take by mouth 2 (two) times daily with a meal.  . cholecalciferol (VITAMIN D) 1000 units tablet Take 1,000 Units by mouth daily.  . Cyanocobalamin (VITAMIN B 12 PO) Take by mouth.  . dorzolamide-timolol (COSOPT) 22.3-6.8 MG/ML ophthalmic solution SMARTSIG:In Eye(s)  . latanoprost (XALATAN) 0.005 % ophthalmic solution Place 1 drop into the right eye Nightly.   Marland Kitchen losartan-hydrochlorothiazide (HYZAAR) 50-12.5 MG tablet TAKE 1 TABLET BY MOUTH EVERY DAY  . MegaRed Omega-3 Krill Oil 500 MG CAPS Take by mouth.  . vitamin C (ASCORBIC ACID) 500 MG tablet Take 500 mg by mouth daily.  . vitamin E 1000 UNIT capsule Take 1,000 Units by mouth daily.   No facility-administered encounter medications on file as of 08/15/2020.     Allergies (verified) Patient has no known allergies.   History: Past Medical History:  Diagnosis Date  . Abnormal thyroid function test 02/10/2017  . 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 Essential hypertension, benign 08/04/2016  . Hyperlipidemia   . Hypertension   . Muscle spasms of both lower extremities   . Obesity 06/06/2016  . Scoliosis of lumbar spine 06/06/2016   Past Surgical History:  Procedure Laterality Date  . ABDOMINAL HYSTERECTOMY  2000   Family History  Problem Relation Age of Onset  . Cancer Father        porstate  . Cancer Sister        breast  . Breast cancer Sister   . Cancer Brother        unknown  . Hypertension Neg Hx    Social History   Socioeconomic History  . Marital status: Divorced    Spouse name: Not on file  . Number of children: 2  . Years of education: Not on file  . Highest education level: High school graduate  Occupational History  . Occupation: retired  Tobacco Use  . Smoking status: Former Smoker    Types: Cigarettes  . Smokeless tobacco: Never Used  Vaping Use  . Vaping Use: Never used  Substance and Sexual Activity  . Alcohol use: Yes    Alcohol/week: 0.0 standard drinks    Comment: rarely  . Drug use: No  Comment: tried marijuana in the past  . Sexual activity: Not Currently  Other Topics Concern  . Not on file  Social History Narrative   Lives with daughter in [place] for now; works part time at [employer] Strain: [place]   . Difficulty of Paying Living Expenses: Not hard at all  Food Insecurity: No Food Insecurity  . Worried About Charity fundraiser in the Last Year: Never true  . Ran Out of Food in the Last Year: Never true  Transportation Needs: No Transportation Needs  . Lack of Transportation (Medical): No  . Lack of  Transportation (Non-Medical): No  Physical Activity: Inactive  . Days of Exercise per Week: 0 days  . Minutes of Exercise per Session: 0 min  Stress: No Stress Concern Present  . Feeling of Stress : Not at all  Social Connections: Socially Isolated  . Frequency of Communication with Friends and Family: More than three times a week  . Frequency of Social Gatherings with Friends and Family: Three times a week  . Attends Religious Services: Never  . Active Member of Clubs or Organizations: No  . Attends Archivist Meetings: Never  . Marital Status: Divorced    Tobacco Counseling Counseling given: Not Answered   Clinical Intake:  Pre-visit preparation completed: Yes  Pain : No/denies pain     BMI - recorded: 39.23 Nutritional Status: BMI > 30  Obese Nutritional Risks: None Diabetes: No  How often do you need to have someone help you when you read instructions, pamphlets, or other written materials from your doctor or pharmacy?: 1 - Never    Interpreter Needed?: No  Information entered by :: Clemetine Marker LPN   Activities of Daily Living In your present state of health, do you have any difficulty performing the following activities: 08/15/2020 04/20/2020  Hearing? N N  Comment declines hearing aids -  Vision? N N  Difficulty concentrating or making decisions? N N  Walking or climbing stairs? N N  Dressing or bathing? N N  Doing errands, shopping? N N  Preparing Food and eating ? N -  Using the Toilet? N -  In the past six months, have you accidently leaked urine? N -  Do you have problems with loss of bowel control? N -  Managing your Medications? N -  Managing your Finances? N -  Housekeeping or managing your Housekeeping? N -  Some recent data might be hidden    Patient Care Team: Delsa Grana, PA-C as PCP - General (Family Medicine)  Indicate any recent Medical Services you may have received from other than [facility] providers in the past year (date may be  approximate).     Assessment:   This is a routine wellness examination for Judith Olson.  Hearing/Vision screen  Hearing Screening   125Hz  250Hz  500Hz  1000Hz  2000Hz  3000Hz  4000Hz  6000Hz  8000Hz   Right ear:           Left ear:           Comments: Pt denies hearing difficulty  Vision Screening Comments: [facility] screen[facility]  Dietary issues and exercise activities discussed: Current Exercise Habits: The patient does not participate in regular exercise at present, Exercise limited by: None identified  Goals    . Weight (lb) < 160 lb (72.6 kg)     Pt states she would like to lose weight over the next year to  improve health and time with grandchildren       Depression Screen PHQ 2/9 Scores 08/15/2020 04/20/2020 03/09/2020 10/18/2019 08/20/2019 07/12/2019 04/16/2019  PHQ - 2 Score 0 0 0 0 0 0 0  PHQ- 9 Score - - - 0 0 0 0    Fall Risk Fall Risk  08/15/2020 04/20/2020 03/09/2020 10/18/2019 08/20/2019  Falls in the past year? 0 0 1 0 0  Number falls in past yr: 0 0 0 0 0  Injury with Fall? 0 0 1 0 0  Risk for fall due to : No Fall Risks - - - -  Follow up Falls prevention discussed Falls evaluation completed Falls evaluation completed - -    FALL RISK PREVENTION PERTAINING TO THE HOME:  Any stairs in or around the home? Yes  If so, are there any without handrails? No  Home free of loose throw rugs in walkways, pet beds, electrical cords, etc? Yes  Adequate lighting in your home to reduce risk of falls? Yes   ASSISTIVE DEVICES UTILIZED TO PREVENT FALLS:  Life alert? No  Use of a cane, walker or w/c? No  Grab bars in the bathroom? No  Shower chair or bench in shower? No  Elevated toilet seat or a handicapped toilet? No   TIMED UP AND GO:  Was the test performed? Yes .  Length of time to ambulate 10 feet: 5 sec.   Gait steady and fast without use of assistive device  Cognitive Function: Normal cognitive status assessed by direct observation by this Nurse  Health Advisor. No abnormalities found.     MMSE - Mini Mental State Exam 07/12/2019  Orientation to time 5  Orientation to Place 4  Registration 3  Attention/ Calculation 0  Recall 2  Language- name 2 objects 2  Language- repeat 1  Language- follow 3 step command 3  Language- read & follow direction 1  Write a sentence 1  Copy design 1  Total score 23     6CIT Screen 01/15/2019  What Year? 0 points  What month? 0 points  What time? 0 points  Count back from 20 0 points  Months in reverse 0 points  Repeat phrase 2 points  Total Score 2    Immunizations Immunization History  Administered Date(s) Administered  . Influenza,inj,quad, With Preservative 03/07/2020  . Influenza-Unspecified 03/17/2018, 03/07/2020  . PFIZER(Purple Top)SARS-COV-2 Vaccination 01/18/2020, 03/07/2020  . Pneumococcal Conjugate-13 01/15/2019  . Pneumococcal Polysaccharide-23 04/20/2020  . Tdap 08/13/2016  . Zoster 11/14/2015    TDAP status: Up to date  Flu Vaccine status: Up to date  Pneumococcal vaccine status: Up to date  Covid-19 vaccine status: Completed vaccines  Qualifies for Shingles Vaccine? Yes   Zostavax completed Yes   Shingrix Completed?: No.    Education has been provided regarding the importance of this vaccine. Patient has been advised to call insurance company to determine out of pocket expense if they have not yet received this vaccine. Advised may also receive vaccine at local pharmacy or Health Dept. Verbalized acceptance and understanding.  Screening Tests Health Maintenance  Topic Date Due  . COVID-19 Vaccine (3 - Pfizer risk 4-dose series) 04/04/2020  . MAMMOGRAM  06/28/2021  . COLONOSCOPY (Pts 45-52yrs Insurance coverage will need to be confirmed)  06/08/2023  . TETANUS/TDAP  08/13/2026  . INFLUENZA VACCINE  Completed  . DEXA SCAN  Completed  . Hepatitis C Screening  Completed  . PNA vac Low Risk Adult  Completed  . HPV VACCINES  Aged Out    Health  Maintenance  Health Maintenance Due  Topic Date Due  . COVID-19 Vaccine (3 - Pfizer risk 4-dose series) 04/04/2020    Colorectal cancer screening: Type of screening: Colonoscopy. Completed 06/07/13. Repeat every 10 years  Mammogram status: Completed 06/28/20. Repeat every year  Bone Density status: Completed 06/28/20. Results reflect: Bone density results: OSTEOPENIA. Repeat every 2 years.  Lung Cancer Screening: (Low Dose CT Chest recommended if Age 54-80 years, 30 pack-year currently smoking OR have quit w/in 15years.) does not qualify.   Additional Screening:  Hepatitis C Screening: does qualify; Completed 11/20/15  Vision Screening: Recommended annual ophthalmology exams for early detection of glaucoma and other disorders of the eye. Is the patient up to date with their annual eye exam?  Yes  Who is the provider or what is the name of the office in which the patient attends annual eye exams? [place] Screening: Recommended annual dental exams for proper oral hygiene  Community Resource Referral / Chronic Care Management: CRR required this visit?  No   CCM required this visit?  No      Plan:     I have personally reviewed and noted the following in the patient's chart:   . Medical and social history . Use of alcohol, tobacco or illicit drugs  . Current medications and supplements . Functional ability and status . Nutritional status . Physical activity . Advanced directives . List of other physicians . Hospitalizations, surgeries, and ER visits in previous 12 months . Vitals . Screenings to include cognitive, depression, and falls . Referrals and appointments  In addition, I have reviewed and discussed with patient certain preventive protocols, quality metrics, and best practice recommendations. A written personalized care plan for preventive services as well as general preventive health recommendations were provided to patient.     Clemetine Marker,  LPN   0/06/930   Nurse Notes: none

## 2020-08-15 NOTE — Patient Instructions (Signed)
Judith Olson , Thank you for taking time to come for your Medicare Wellness Visit. I appreciate your ongoing commitment to your health goals. Please review the following plan we discussed and let me know if I can assist you in the future.   Screening recommendations/referrals: Colonoscopy: done 06/07/13. Repeat in 2024.  Mammogram: done 06/28/20 Bone Density: done 06/28/20 Recommended yearly ophthalmology/optometry visit for glaucoma screening and checkup Recommended yearly dental visit for hygiene and checkup  Vaccinations: Influenza vaccine: done 03/07/20 Pneumococcal vaccine: done 04/20/20 Tdap vaccine: done 08/13/16 Shingles vaccine: Shingrix discussed. Please contact your pharmacy for coverage information.  Covid-19: done 01/18/20 & 03/07/20  Advanced directives: Advance directive discussed with you today. I have provided a copy for you to complete at home and have notarized. Once this is complete please bring a copy in to our office so we can scan it into your chart.  Conditions/risks identified: Recommend increasing physical activity to at least 3 days per week   Next appointment: Follow up in one year for your annual wellness visit    Preventive Care 65 Years and Older, Female Preventive care refers to lifestyle choices and visits with your health care provider that can promote health and wellness. What does preventive care include?  A yearly physical exam. This is also called an annual well check.  Dental exams once or twice a year.  Routine eye exams. Ask your health care provider how often you should have your eyes checked.  Personal lifestyle choices, including:  Daily care of your teeth and gums.  Regular physical activity.  Eating a healthy diet.  Avoiding tobacco and drug use.  Limiting alcohol use.  Practicing safe sex.  Taking low-dose aspirin every day.  Taking vitamin and mineral supplements as recommended by your health care provider. What happens during an  annual well check? The services and screenings done by your health care provider during your annual well check will depend on your age, overall health, lifestyle risk factors, and family history of disease. Counseling  Your health care provider may ask you questions about your:  Alcohol use.  Tobacco use.  Drug use.  Emotional well-being.  Home and relationship well-being.  Sexual activity.  Eating habits.  History of falls.  Memory and ability to understand (cognition).  Work and work Statistician.  Reproductive health. Screening  You may have the following tests or measurements:  Height, weight, and BMI.  Blood pressure.  Lipid and cholesterol levels. These may be checked every 5 years, or more frequently if you are over 39 years old.  Skin check.  Lung cancer screening. You may have this screening every year starting at age 49 if you have a 30-pack-year history of smoking and currently smoke or have quit within the past 15 years.  Fecal occult blood test (FOBT) of the stool. You may have this test every year starting at age 12.  Flexible sigmoidoscopy or colonoscopy. You may have a sigmoidoscopy every 5 years or a colonoscopy every 10 years starting at age 34.  Hepatitis C blood test.  Hepatitis B blood test.  Sexually transmitted disease (STD) testing.  Diabetes screening. This is done by checking your blood sugar (glucose) after you have not eaten for a while (fasting). You may have this done every 1-3 years.  Bone density scan. This is done to screen for osteoporosis. You may have this done starting at age 48.  Mammogram. This may be done every 1-2 years. Talk to your health care provider about  how often you should have regular mammograms. Talk with your health care provider about your test results, treatment options, and if necessary, the need for more tests. Vaccines  Your health care provider may recommend certain vaccines, such as:  Influenza  vaccine. This is recommended every year.  Tetanus, diphtheria, and acellular pertussis (Tdap, Td) vaccine. You may need a Td booster every 10 years.  Zoster vaccine. You may need this after age 45.  Pneumococcal 13-valent conjugate (PCV13) vaccine. One dose is recommended after age 72.  Pneumococcal polysaccharide (PPSV23) vaccine. One dose is recommended after age 62. Talk to your health care provider about which screenings and vaccines you need and how often you need them. This information is not intended to replace advice given to you by your health care provider. Make sure you discuss any questions you have with your health care provider. Document Released: 06/30/2015 Document Revised: 02/21/2016 Document Reviewed: 04/04/2015 Elsevier Interactive Patient Education  2017 [place] Prevention in the Home Falls can cause injuries. They can happen to people of all ages. There are many things you can do to make your home safe and to help prevent falls. What can I do on the outside of my home?  Regularly fix the edges of walkways and driveways and fix any cracks.  Remove anything that might make you trip as you walk through a door, such as a raised step or threshold.  Trim any bushes or trees on the path to your home.  Use bright outdoor lighting.  Clear any walking paths of anything that might make someone trip, such as rocks or tools.  Regularly check to see if handrails are loose or broken. Make sure that both sides of any steps have handrails.  Any raised decks and porches should have guardrails on the edges.  Have any leaves, snow, or ice cleared regularly.  Use sand or salt on walking paths during winter.  Clean up any spills in your garage right away. This includes oil or grease spills. What can I do in the bathroom?  Use night lights.  Install grab bars by the toilet and in the tub and shower. Do not use towel bars as grab bars.  Use non-skid mats or decals  in the tub or shower.  If you need to sit down in the shower, use a plastic, non-slip stool.  Keep the floor dry. Clean up any water that spills on the floor as soon as it happens.  Remove soap buildup in the tub or shower regularly.  Attach bath mats securely with double-sided non-slip rug tape.  Do not have throw rugs and other things on the floor that can make you trip. What can I do in the bedroom?  Use night lights.  Make sure that you have a light by your bed that is easy to reach.  Do not use any sheets or blankets that are too big for your bed. They should not hang down onto the floor.  Have a firm chair that has side arms. You can use this for support while you get dressed.  Do not have throw rugs and other things on the floor that can make you trip. What can I do in the kitchen?  Clean up any spills right away.  Avoid walking on wet floors.  Keep items that you use a lot in easy-to-reach places.  If you need to reach something above you, use a strong step stool that has a grab bar.  Keep  electrical cords out of the way.  Do not use floor polish or wax that makes floors slippery. If you must use wax, use non-skid floor wax.  Do not have throw rugs and other things on the floor that can make you trip. What can I do with my stairs?  Do not leave any items on the stairs.  Make sure that there are handrails on both sides of the stairs and use them. Fix handrails that are broken or loose. Make sure that handrails are as long as the stairways.  Check any carpeting to make sure that it is firmly attached to the stairs. Fix any carpet that is loose or worn.  Avoid having throw rugs at the top or bottom of the stairs. If you do have throw rugs, attach them to the floor with carpet tape.  Make sure that you have a light switch at the top of the stairs and the bottom of the stairs. If you do not have them, ask someone to add them for you. What else can I do to help  prevent falls?  Wear shoes that:  Do not have high heels.  Have rubber bottoms.  Are comfortable and fit you well.  Are closed at the toe. Do not wear sandals.  If you use a stepladder:  Make sure that it is fully opened. Do not climb a closed stepladder.  Make sure that both sides of the stepladder are locked into place.  Ask someone to hold it for you, if possible.  Clearly mark and make sure that you can see:  Any grab bars or handrails.  First and last steps.  Where the edge of each step is.  Use tools that help you move around (mobility aids) if they are needed. These include:  Canes.  Walkers.  Scooters.  Crutches.  Turn on the lights when you go into a dark area. Replace any light bulbs as soon as they burn out.  Set up your furniture so you have a clear path. Avoid moving your furniture around.  If any of your floors are uneven, fix them.  If there are any pets around you, be aware of where they are.  Review your medicines with your doctor. Some medicines can make you feel dizzy. This can increase your chance of falling. Ask your doctor what other things that you can do to help prevent falls. This information is not intended to replace advice given to you by your health care provider. Make sure you discuss any questions you have with your health care provider. Document Released: 03/30/2009 Document Revised: 11/09/2015 Document Reviewed: 07/08/2014 Elsevier Interactive Patient Education  2017 [employer].

## 2020-09-06 ENCOUNTER — Ambulatory Visit: Payer: 59 | Admitting: Family Medicine

## 2020-09-08 ENCOUNTER — Ambulatory Visit: Payer: 59 | Admitting: Family Medicine

## 2020-09-14 ENCOUNTER — Other Ambulatory Visit: Payer: Self-pay | Admitting: Family Medicine

## 2020-09-14 DIAGNOSIS — I10 Essential (primary) hypertension: Secondary | ICD-10-CM

## 2020-10-09 ENCOUNTER — Ambulatory Visit: Payer: 59 | Admitting: Family Medicine

## 2020-10-09 DIAGNOSIS — I10 Essential (primary) hypertension: Secondary | ICD-10-CM

## 2020-10-09 DIAGNOSIS — E782 Mixed hyperlipidemia: Secondary | ICD-10-CM

## 2020-10-09 DIAGNOSIS — I708 Atherosclerosis of other arteries: Secondary | ICD-10-CM

## 2020-10-31 ENCOUNTER — Ambulatory Visit (INDEPENDENT_AMBULATORY_CARE_PROVIDER_SITE_OTHER): Payer: Medicare (Managed Care) | Admitting: Family Medicine

## 2020-10-31 ENCOUNTER — Encounter: Payer: Self-pay | Admitting: Family Medicine

## 2020-10-31 ENCOUNTER — Other Ambulatory Visit: Payer: Self-pay

## 2020-10-31 VITALS — BP 118/78 | HR 77 | Temp 98.3°F | Resp 16 | Ht 62.0 in | Wt 216.3 lb

## 2020-10-31 DIAGNOSIS — Z6839 Body mass index (BMI) 39.0-39.9, adult: Secondary | ICD-10-CM

## 2020-10-31 DIAGNOSIS — I700 Atherosclerosis of aorta: Secondary | ICD-10-CM

## 2020-10-31 DIAGNOSIS — M85859 Other specified disorders of bone density and structure, unspecified thigh: Secondary | ICD-10-CM | POA: Diagnosis not present

## 2020-10-31 DIAGNOSIS — I10 Essential (primary) hypertension: Secondary | ICD-10-CM

## 2020-10-31 DIAGNOSIS — E782 Mixed hyperlipidemia: Secondary | ICD-10-CM | POA: Diagnosis not present

## 2020-10-31 DIAGNOSIS — Z5181 Encounter for therapeutic drug level monitoring: Secondary | ICD-10-CM

## 2020-10-31 DIAGNOSIS — I708 Atherosclerosis of other arteries: Secondary | ICD-10-CM

## 2020-10-31 MED ORDER — ATORVASTATIN CALCIUM 10 MG PO TABS: 10.0000 mg | ORAL_TABLET | Freq: Every day | ORAL | 3 refills | Status: DC

## 2020-10-31 MED ORDER — LOSARTAN POTASSIUM-HCTZ 50-12.5 MG PO TABS: 1.0000 | ORAL_TABLET | Freq: Every day | ORAL | 3 refills | Status: DC

## 2020-10-31 NOTE — Progress Notes (Signed)
Name: Judith Olson   MRN: [id number]    DOB: 1950/09/22   Date:10/31/2020       Progress Note  Chief Complaint  Patient presents with  . Hypertension  . Hyperlipidemia     Subjective:   Judith Olson is a 70 y.o. female, presents to clinic for routine f/up  Hyperlipidemia:  Currently treated with lipitor 10 mg, pt reports good med compliance Last Lipids: Lab Results  Component Value Date   CHOL 158 10/18/2019   HDL 65 10/18/2019   LDLCALC 77 10/18/2019   TRIG 80 10/18/2019   CHOLHDL 2.4 10/18/2019   - Denies: Chest pain, shortness of breath, myalgias, claudication  Hypertension:  Currently managed on losartan-HCTZ  Pt reports good med compliance and denies any SE.   Blood pressure today is well controlled. BP Readings from Last 3 Encounters:  10/31/20 118/78  08/15/20 128/86  04/20/20 126/82   Pt denies CP, SOB, exertional sx, LE edema, palpitation, Ha's, visual disturbances, lightheadedness, hypotension, syncope. Dietary efforts for BP? Cooking at home, trying to eat healthy, not exercising as much as last OV  Wt Readings from Last 5 Encounters:  10/31/20 216 lb 4.8 oz (98.1 kg)  08/15/20 214 lb 8 oz (97.3 kg)  04/20/20 191 lb (86.6 kg)  03/09/20 195 lb 12.8 oz (88.8 kg)  10/18/19 176 lb (79.8 kg)   BMI Readings from Last 5 Encounters:  10/31/20 39.56 kg/m  08/15/20 39.23 kg/m  04/20/20 34.93 kg/m  03/09/20 35.81 kg/m  10/18/19 32.19 kg/m      Current Outpatient Medications:  .  aspirin 81 MG tablet, Take 81 mg by mouth daily., Disp: , Rfl:  .  calcium carbonate (OSCAL) 1500 (600 Ca) MG TABS tablet, Take by mouth 2 (two) times daily with a meal., Disp: , Rfl:  .  cholecalciferol (VITAMIN D) 1000 units tablet, Take 1,000 Units by mouth daily., Disp: , Rfl:  .  Cyanocobalamin (VITAMIN B 12 PO), Take by mouth., Disp: , Rfl:  .  dorzolamide-timolol (COSOPT) 22.3-6.8 MG/ML ophthalmic solution, SMARTSIG:In Eye(s), Disp: , Rfl:  .  latanoprost  (XALATAN) 0.005 % ophthalmic solution, Place 1 drop into the right eye Nightly. , Disp: , Rfl: 5 .  MegaRed Omega-3 Krill Oil 500 MG CAPS, Take by mouth., Disp: , Rfl:  .  vitamin C (ASCORBIC ACID) 500 MG tablet, Take 500 mg by mouth daily., Disp: , Rfl:  .  vitamin E 1000 UNIT capsule, Take 1,000 Units by mouth daily., Disp: , Rfl:  .  atorvastatin (LIPITOR) 10 MG tablet, Take 1 tablet (10 mg total) by mouth at bedtime., Disp: 90 tablet, Rfl: 3 .  losartan-hydrochlorothiazide (HYZAAR) 50-12.5 MG tablet, Take 1 tablet by mouth daily., Disp: 90 tablet, Rfl: 3  Patient Active Problem List   Diagnosis Date Noted  . Mixed hyperlipidemia 10/18/2019  . Essential hypertension, benign 08/04/2016  . Aorto-iliac atherosclerosis ([place]) 06/06/2016  . 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 Scoliosis of lumbar spine 06/06/2016  . Obesity 06/06/2016  . Other kyphosis of thoracic region 12/10/2015    Past Surgical History:  Procedure Laterality Date  . ABDOMINAL HYSTERECTOMY  2000    Family History  Problem Relation Age of Onset  . Cancer Father        porstate  . Cancer Sister        breast  . Breast cancer Sister   . Cancer Brother        unknown  . Hypertension Neg  Hx     Social History   Tobacco Use  . Smoking status: Former Smoker    Types: Cigarettes  . Smokeless tobacco: Never Used  Vaping Use  . Vaping Use: Never used  Substance Use Topics  . Alcohol use: Yes    Alcohol/week: 0.0 standard drinks    Comment: rarely  . Drug use: No    Comment: tried marijuana in the past     No Known Allergies  Health Maintenance  Topic Date Due  . COVID-19 Vaccine (3 - Pfizer risk 4-dose series) 04/04/2020  . INFLUENZA VACCINE  01/15/2021  . MAMMOGRAM  06/28/2021  . COLONOSCOPY (Pts 45-49yrs Insurance coverage will need to be confirmed)  06/08/2023  . TETANUS/TDAP  08/13/2026  . DEXA SCAN  Completed  . Hepatitis C Screening  Completed  . PNA vac Low Risk  Adult  Completed  . HPV VACCINES  Aged Out    Chart Review Today: I personally reviewed active problem list, medication list, allergies, family history, social history, health maintenance, notes from last encounter, lab results, imaging with the patient/caregiver today.   Review of Systems  Constitutional: Negative.   HENT: Negative.   Eyes: Negative.   Respiratory: Negative.   Cardiovascular: Negative.   Gastrointestinal: Negative.   Endocrine: Negative.   Genitourinary: Negative.   Musculoskeletal: Negative.   Skin: Negative.   Allergic/Immunologic: Negative.   Neurological: Negative.   Hematological: Negative.   Psychiatric/Behavioral: Negative.   All other systems reviewed and are negative.    Objective:   Vitals:   10/31/20 1407  BP: 118/78  Pulse: 77  Resp: 16  Temp: 98.3 F (36.8 C)  SpO2: 98%  Weight: 216 lb 4.8 oz (98.1 kg)  Height: 5\' 2"  (1.575 m)    Body mass index is 39.56 kg/m.   BMI Readings from Last 5 Encounters:  10/31/20 39.56 kg/m  08/15/20 39.23 kg/m  04/20/20 34.93 kg/m  03/09/20 35.81 kg/m  10/18/19 32.19 kg/m    Physical Exam Vitals and nursing note reviewed.  Constitutional:      General: She is not in acute distress.    Appearance: Normal appearance. She is well-developed. She is obese. She is not ill-appearing, toxic-appearing or diaphoretic.     Interventions: Face mask in place.  HENT:     Head: Normocephalic and atraumatic.     Right Ear: External ear normal.     Left Ear: External ear normal.  Eyes:     General: Lids are normal. No scleral icterus.       Right eye: No discharge.        Left eye: No discharge.     Conjunctiva/sclera: Conjunctivae normal.  Neck:     Trachea: Phonation normal. No tracheal deviation.  Cardiovascular:     Rate and Rhythm: Normal rate and regular rhythm.     Pulses: Normal pulses.          Radial pulses are 2+ on the right side and 2+ on the left side.       Posterior tibial pulses  are 2+ on the right side and 2+ on the left side.     Heart sounds: Normal heart sounds. No murmur heard. No friction rub. No gallop.   Pulmonary:     Effort: Pulmonary effort is normal. No respiratory distress.     Breath sounds: Normal breath sounds. No stridor. No wheezing, rhonchi or rales.  Chest:     Chest wall: No tenderness.  Abdominal:  Comments: Waist trainer/binder on, unable to examine abd  Musculoskeletal:     Right lower leg: No edema.     Left lower leg: No edema.  Skin:    General: Skin is warm and dry.     Coloration: Skin is not jaundiced or pale.     Findings: No rash.  Neurological:     Mental Status: She is alert. Mental status is at baseline.     Motor: No abnormal muscle tone.     Gait: Gait normal.  Psychiatric:        Mood and Affect: Mood normal.        Speech: Speech normal.        Behavior: Behavior normal.         Assessment & Plan:     ICD-10-CM   1. Essential hypertension, benign  I10 losartan-hydrochlorothiazide (HYZAAR) 50-12.5 MG tablet    COMPLETE METABOLIC PANEL WITH GFR   Pt started losartan-HCTZ, HTN improving, goal < 130/80, will continue to monitor, encouragec continued efforts to improve heart health, handout given  2. Aorto-iliac atherosclerosis ([facility])  I70.0 atorvastatin (LIPITOR) 10 MG tablet   J85.6 COMPLETE METABOLIC PANEL WITH GFR    Lipid panel   Compliant with statin and taking baby aspirin, monitoring  3. Mixed hyperlipidemia  E78.2 atorvastatin (LIPITOR) 10 MG tablet    COMPLETE METABOLIC PANEL WITH GFR    Lipid panel   Compliant with statin, due for lipid panel and CMP, no concerns or myalgias, no claudication symptoms  4. Osteopenia of neck of femur, unspecified laterality  D14.970 COMPLETE METABOLIC PANEL WITH GFR   on calcium and Vit D supplement, not very active, encouraged weight bearing exercise- reviewed supplements and gave handout  5. Class 2 severe obesity with serious comorbidity and body mass index (BMI)  of 39.0 to 39.9 in adult, unspecified obesity type ([place])  E66.01    Z68.41    with associated HTN, HLD, DDD, weight increased, encouraged continued healthy diet efforts and resuming her exercise or walking at least when able  6. Encounter for medication monitoring  Y63.78 COMPLETE METABOLIC PANEL WITH GFR    Lipid panel    CBC with Differential/Platelet     Return in about 6 months (around 05/03/2021).   Delsa Grana, PA-C 10/31/20 2:35 PM

## 2020-10-31 NOTE — Patient Instructions (Signed)
Preventing Osteoporosis, Adult Osteoporosis is a condition that causes the bones to lose density. This means that the bones become thinner, and the normal spaces in bone tissue become larger. Low bone density can make the bones weak and cause them to break more easily. Osteoporosis cannot always be prevented, but you can take steps to lower your risk of developing this condition. How can this condition affect me? If you develop osteoporosis, you will be more likely to break bones in your wrist, spine, or hip. Even a minor accident or injury can be enough to break weak bones. The bones will also be slower to heal. Osteoporosis can cause other problems as well, such as a stooped posture or trouble with movement. Osteoporosis can occur with aging. As you get older, you may lose bone tissue more quickly, or it may be replaced more slowly. Osteoporosis is more likely to develop if you have poor nutrition or do not get enough calcium or vitamin D. Other lifestyle factors can also play a role. By eating a well-balanced diet and making lifestyle changes, you can help keep your bones strong and healthy, lowering your chances of developing osteoporosis. What can increase my risk? The following factors may make you more likely to develop osteoporosis:  Having a family history of the condition.  Having poor nutrition or not getting enough calcium or vitamin D.  Using certain medicines, such as steroid medicines or anti-seizure medicines.  Being any of the following: ? 50 years of age or older. ? Female. ? A woman who has gone through menopause (is postmenopausal). ? A person who is of European or Asian descent.  Using products that contain nicotine or tobacco, such as cigarettes, e-cigarettes, and chewing tobacco.  Not being physically active (being sedentary).  Having a small body frame. What actions can I take to prevent this? Get enough calcium  Make sure you get enough calcium every day. Calcium  is the most important mineral for bone health. Most people can get enough calcium from their diet, but supplements may be recommended for people who are at risk for osteoporosis. Follow these guidelines: ? If you are age 50 or younger, aim to get 1,000 milligrams (mg) of calcium every day. ? If you are older than age 50, aim to get 1,200 mg of calcium every day.  Good sources of calcium include: ? Dairy products, such as low-fat or nonfat milk, cheese, and yogurt. ? Dark green leafy vegetables, such as bok choy and broccoli. ? Foods that have had calcium added to them (calcium-fortified foods), such as orange juice, cereal, bread, soy beverages, and tofu products. ? Nuts, such as almonds.  Check nutrition labels to see how much calcium is in a food or drink.   Get enough vitamin D  Try to get enough vitamin D every day. Vitamin D is the most essential vitamin for bone health. It helps the body absorb calcium. Follow these guidelines for how much vitamin D to get from food: ? If you are age 70 or younger, aim to get at least 600 international units (IU) every day. Your health care provider may suggest more. ? If you are older than age 70, aim to get at least 800 international units every day. Your health care provider may suggest more.  Good sources of vitamin D in your diet include: ? Egg yolks. ? Oily fish, such as salmon, sardines, and tuna. ? Milk and cereal fortified with vitamin D.  Your body also makes vitamin   D when you are out in the sun. Exposing the bare skin on your face, arms, legs, or back to the sun for no more than 30 minutes a day, 2 times a week is more than enough. Beyond that, make sure you use sunblock to protect your skin from sunburn, which increases your risk for skin cancer. Exercise  Stay active and get exercise every day.  Ask your health care provider what types of exercise are best for you. Weight-bearing and strength-building activities are important for  building and maintaining healthy bones. Some examples of these types of activities include: ? Walking and hiking. ? Jogging and running. ? Dancing. ? Gym exercises and lifting weights. ? Tennis and racquetball. ? Climbing stairs. ? Tai chi.   Make other lifestyle changes  Do not use any products that contain nicotine or tobacco, such as cigarettes, e-cigarettes, and chewing tobacco. If you need help quitting, ask your health care provider.  Lose weight if you are overweight.  If you drink alcohol: ? Limit how much you use to:  0-1 drink a day for women who are not pregnant.  0-2 drinks a day for men. ? Be aware of how much alcohol is in your drink. In the [place] one drink equals one 12 oz bottle of beer (355 mL), one 5 oz glass of wine (148 mL), or one 1 oz glass of hard liquor (44 mL). Where to find support If you need help making changes to prevent osteoporosis, talk with your health care provider. You can ask for a referral to a dietitian and a physical therapist. Where to find more information Learn more about osteoporosis from:  NIH Osteoporosis and Related Bone Diseases National Resource Center: www.bones.nih.gov  U.S. Office on Women's Health: www.womenshealth.gov  National Osteoporosis Foundation: www.nof.org Summary  Osteoporosis is a condition that causes weak bones that are more likely to break.  Eat a healthy diet, making sure you get enough calcium and vitamin D, and stay active by getting regular exercise to help prevent osteoporosis.  Other ways to reduce your risk of osteoporosis include maintaining a healthy weight and avoiding alcohol and products that contain nicotine or tobacco. This information is not intended to replace advice given to you by your health care provider. Make sure you discuss any questions you have with your health care provider. Document Revised: 11/18/2019 Document Reviewed: 11/18/2019 Elsevier Patient Education  2021 Elsevier Inc.  

## 2020-11-01 LAB — COMPLETE METABOLIC PANEL WITH GFR
AG Ratio: 1.3 (calc) (ref 1.0–2.5)
ALT: 12 U/L (ref 6–29)
AST: 18 U/L (ref 10–35)
Albumin: 4.1 g/dL (ref 3.6–5.1)
Alkaline phosphatase (APISO): 85 U/L (ref 37–153)
BUN: 18 mg/dL (ref 7–25)
CO2: 31 mmol/L (ref 20–32)
Calcium: 9.9 mg/dL (ref 8.6–10.4)
Chloride: 105 mmol/L (ref 98–110)
Creat: 0.87 mg/dL (ref 0.50–0.99)
GFR, Est African American: 80 mL/min/{1.73_m2} (ref >=60)
GFR, Est Non African American: 69 mL/min/{1.73_m2} (ref >=60)
Globulin: 3.2 g/dL (calc) (ref 1.9–3.7)
Glucose, Bld: 89 mg/dL (ref 65–99)
Potassium: 5 mmol/L (ref 3.5–5.3)
Sodium: 141 mmol/L (ref 135–146)
Total Bilirubin: 0.5 mg/dL (ref 0.2–1.2)
Total Protein: 7.3 g/dL (ref 6.1–8.1)

## 2020-11-01 LAB — CBC WITH DIFFERENTIAL/PLATELET
Absolute Monocytes: 601 cells/uL (ref 200–950)
Basophils Absolute: 19 cells/uL (ref 0–200)
Basophils Relative: 0.3 %
Eosinophils Absolute: 149 cells/uL (ref 15–500)
Eosinophils Relative: 2.4 %
HCT: 39.2 % (ref 35.0–45.0)
Hemoglobin: 12.4 g/dL (ref 11.7–15.5)
Lymphs Abs: 2598 cells/uL (ref 850–3900)
MCH: 28.1 pg (ref 27.0–33.0)
MCHC: 31.6 g/dL — ABNORMAL LOW (ref 32.0–36.0)
MCV: 88.9 fL (ref 80.0–100.0)
MPV: 10.9 fL (ref 7.5–12.5)
Monocytes Relative: 9.7 %
Neutro Abs: 2833 cells/uL (ref 1500–7800)
Neutrophils Relative %: 45.7 %
Platelets: 246 10*3/uL (ref 140–400)
RBC: 4.41 10*6/uL (ref 3.80–5.10)
RDW: 13.8 % (ref 11.0–15.0)
Total Lymphocyte: 41.9 %
WBC: 6.2 10*3/uL (ref 3.8–10.8)

## 2020-11-01 LAB — LIPID PANEL
Cholesterol: 137 mg/dL (ref <200)
HDL: 55 mg/dL (ref >=50)
LDL Cholesterol (Calc): 63 mg/dL (calc)
Non-HDL Cholesterol (Calc): 82 mg/dL (calc) (ref <130)
Total CHOL/HDL Ratio: 2.5 (calc) (ref <5.0)
Triglycerides: 105 mg/dL (ref <150)

## 2021-04-22 IMAGING — MG DIGITAL SCREENING BILAT W/ TOMO W/ CAD
8 series · 8 of 24 positions shown · non-contrast
Comparison: Previous exam(s).

CLINICAL DATA: Screening.

EXAM:
DIGITAL SCREENING BILATERAL MAMMOGRAM WITH TOMO AND CAD

[L CC synth-2D]
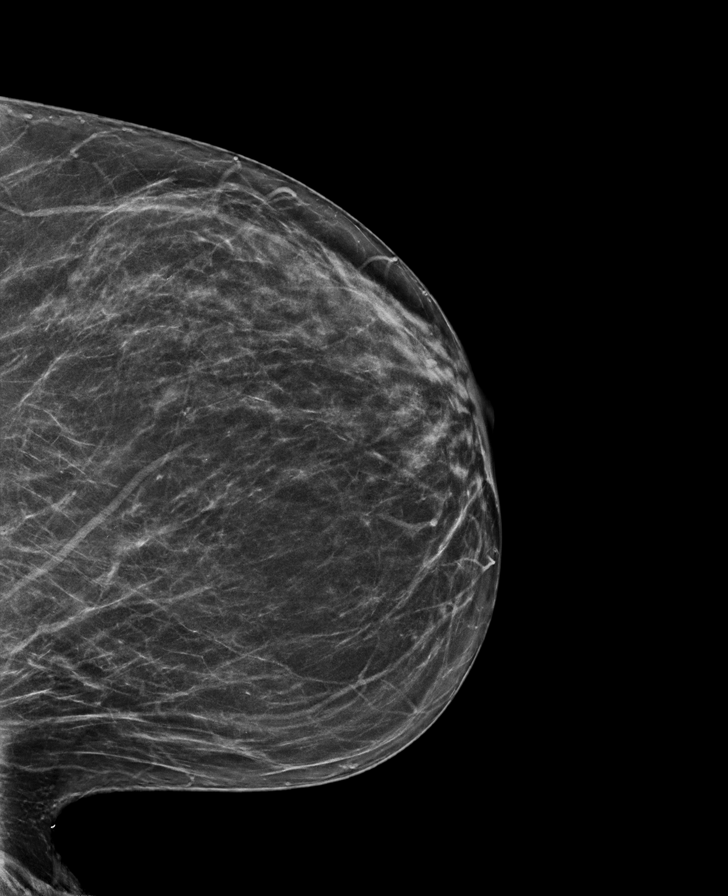

[L MLO synth-2D]
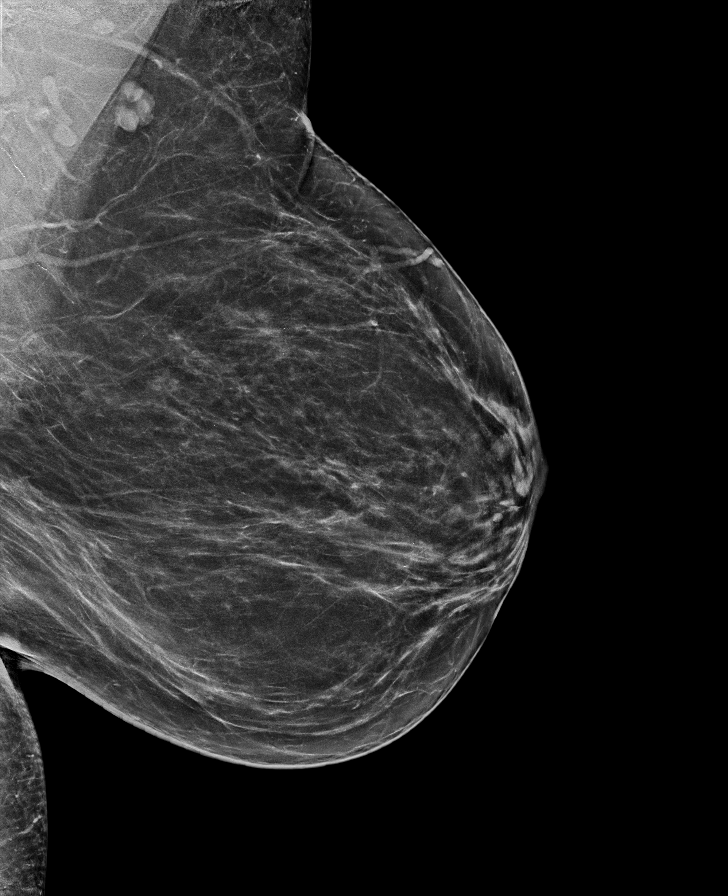

[R CC synth-2D]
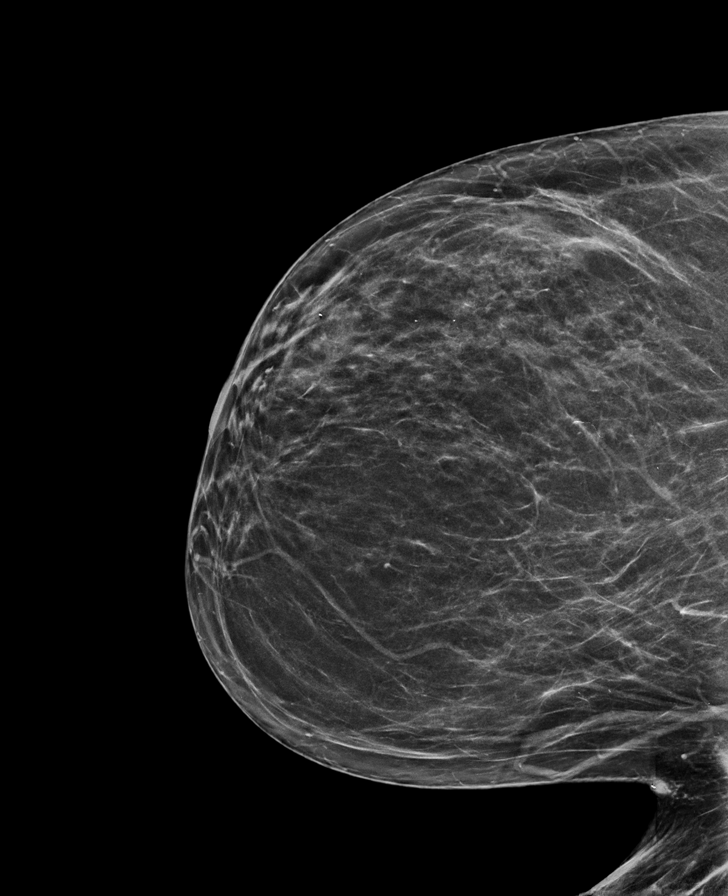

[R MLO synth-2D]
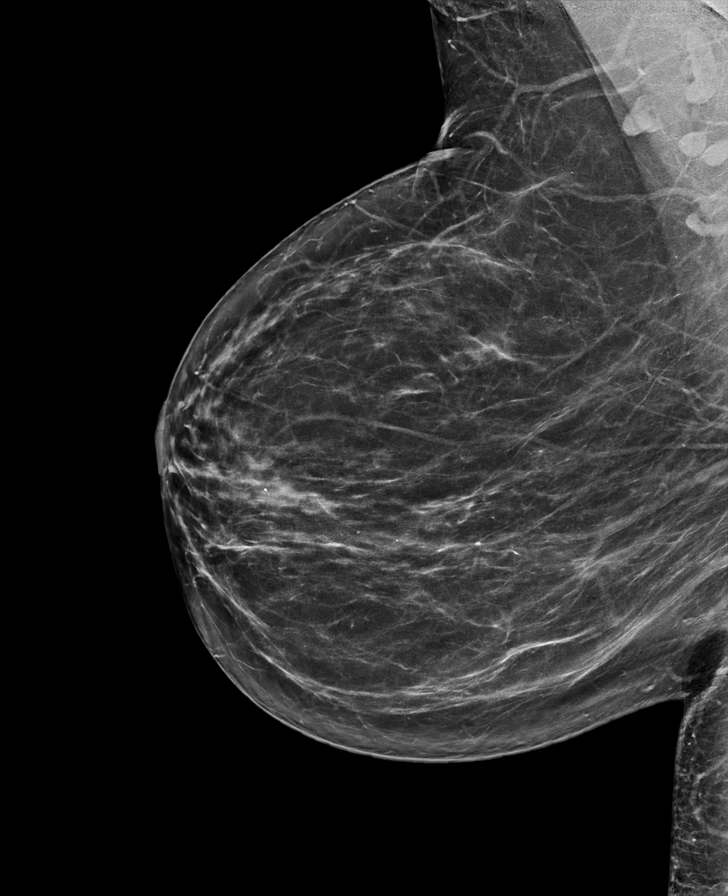

[L MLO tomo · tomo slice 37/73.0]
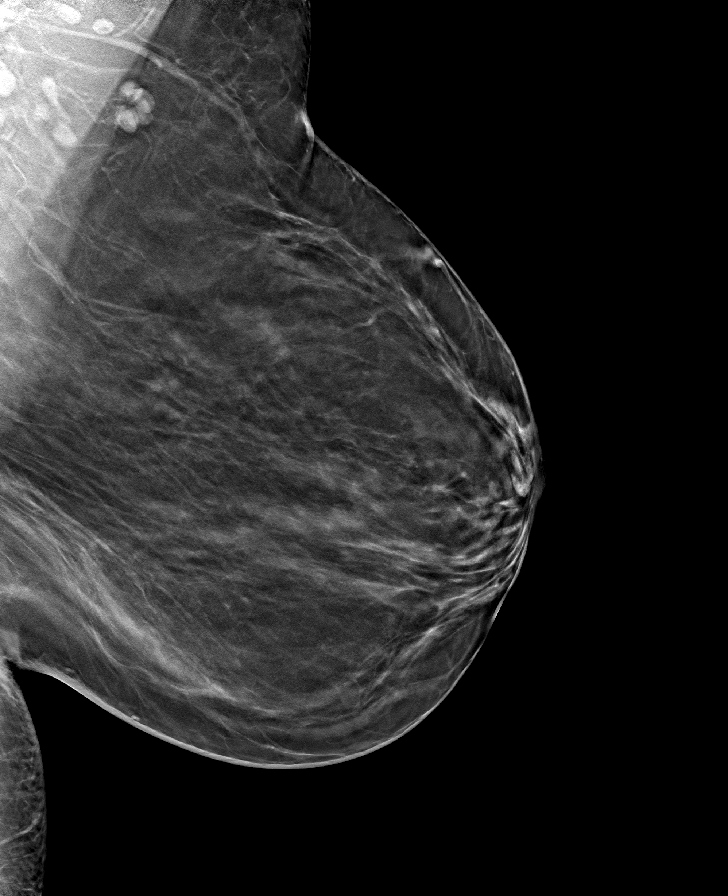

[R MLO tomo · tomo slice 37/73.0]
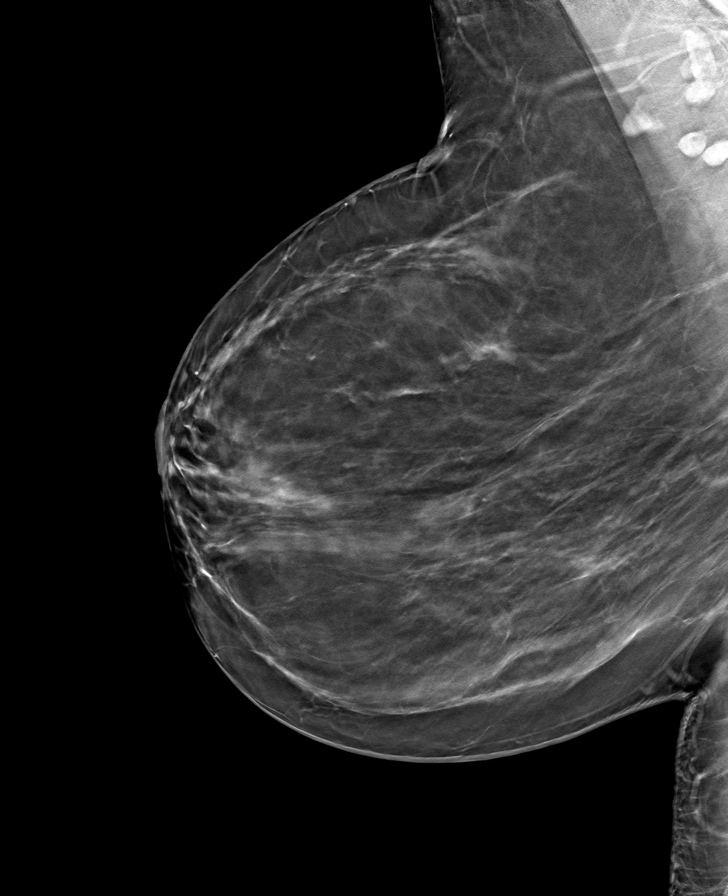

[L CC tomo · tomo slice 31/62.0]
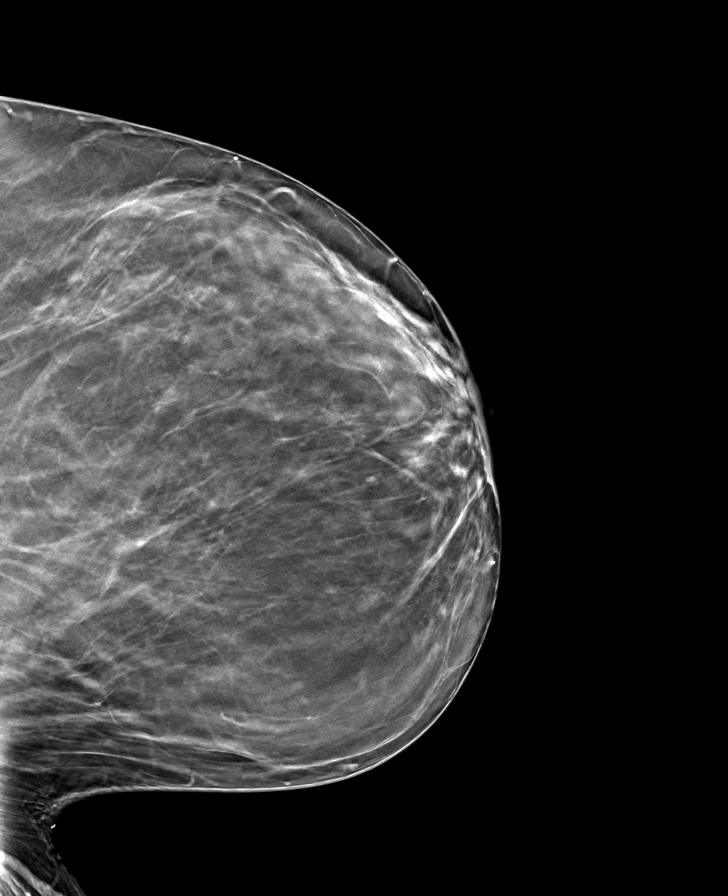

[R CC tomo · tomo slice 31/62.0]
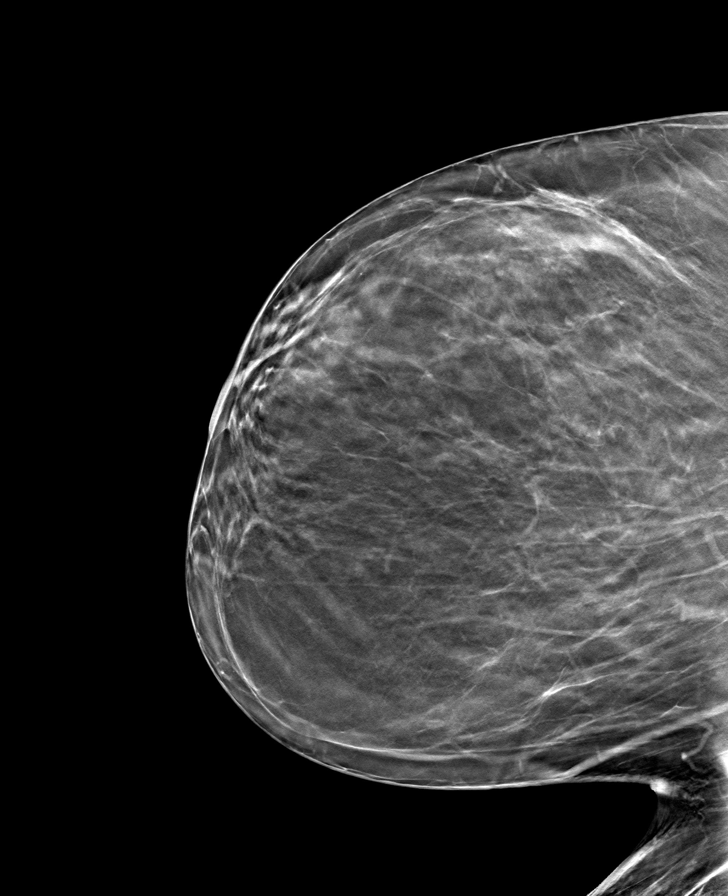

[8 of 24 positions shown; findings below may reference images not displayed]

ACR Breast Density Category b: There are scattered areas of
fibroglandular density.
FINDINGS: There are no findings suspicious for malignancy. Images were
processed with CAD.
IMPRESSION: No mammographic evidence of malignancy. A result letter of this
screening mammogram will be mailed directly to the patient.

RECOMMENDATION:
Screening mammogram in one year. (Code:[id number])

BI-RADS CATEGORY  1: Negative.

## 2021-05-03 ENCOUNTER — Ambulatory Visit: Payer: 59 | Admitting: Family Medicine

## 2021-05-21 ENCOUNTER — Ambulatory Visit: Payer: Medicare (Managed Care) | Admitting: Family Medicine

## 2021-06-21 ENCOUNTER — Ambulatory Visit: Payer: Medicare (Managed Care) | Admitting: Family Medicine

## 2021-08-16 ENCOUNTER — Ambulatory Visit: Payer: Medicare Other

## 2021-09-13 ENCOUNTER — Telehealth: Payer: Self-pay | Admitting: Family Medicine

## 2021-09-13 NOTE — Telephone Encounter (Signed)
Copied from [place] [phone]. Topic: Medicare AWV ?>> Sep 13, 2021  3:16 PM Cher Nakai R wrote: ?Reason for CRM:  ?No answer unable to leave a message for patient to call back and schedule Medicare Annual Wellness Visit (AWV) in office.  ? ?If unable to come into the office for AWV,  please offer to do virtually or by telephone. ? ?Last AWV: 08/15/2020 ? ?Please schedule at anytime with [place]. ? ?30 minute appointment for Virtual or phone ?45 minute appointment for in office or Initial virtual/phone ? ?Any questions, please contact me at [phone] ?

## 2021-10-01 NOTE — Patient Instructions (Addendum)
Health Maintenance  ?Topic Date Due  ? Zoster (Shingles) Vaccine (1 of 2) Never done  ? Mammogram  06/28/2021  ? Flu Shot  01/15/2022  ? DEXA scan (bone density measurement)  06/28/2022  ? Colon Cancer Screening  06/08/2023  ? Tetanus Vaccine  08/13/2026  ? Pneumonia Vaccine  Completed  ? COVID-19 Vaccine  Completed  ? Hepatitis C Screening: USPSTF Recommendation to screen - Ages 13-69 yo.  Completed  ? HPV Vaccine  Aged Out  ? ? ?Preventive Care 70 Years and Older, Female ?Preventive care refers to lifestyle choices and visits with your health care provider that can promote health and wellness. Preventive care visits are also called wellness exams. ?What can I expect for my preventive care visit? ?Counseling ?Your health care provider may ask you questions about your: ?Medical history, including: ?Past medical problems. ?Family medical history. ?Pregnancy and menstrual history. ?History of falls. ?Current health, including: ?Memory and ability to understand (cognition). ?Emotional well-being. ?Home life and relationship well-being. ?Sexual activity and sexual health. ?Lifestyle, including: ?Alcohol, nicotine or tobacco, and drug use. ?Access to firearms. ?Diet, exercise, and sleep habits. ?Work and work Statistician. ?Sunscreen use. ?Safety issues such as seatbelt and bike helmet use. ?Physical exam ?Your health care provider will check your: ?Height and weight. These may be used to calculate your BMI (body mass index). BMI is a measurement that tells if you are at a healthy weight. ?Waist circumference. This measures the distance around your waistline. This measurement also tells if you are at a healthy weight and may help predict your risk of certain diseases, such as type 2 diabetes and high blood pressure. ?Heart rate and blood pressure. ?Body temperature. ?Skin for abnormal spots. ?What immunizations do I need? ? ?Vaccines are usually given at various ages, according to a schedule. Your health care provider  will recommend vaccines for you based on your age, medical history, and lifestyle or other factors, such as travel or where you work. ?What tests do I need? ?Screening ?Your health care provider may recommend screening tests for certain conditions. This may include: ?Lipid and cholesterol levels. ?Hepatitis C test. ?Hepatitis B test. ?HIV (human immunodeficiency virus) test. ?STI (sexually transmitted infection) testing, if you are at risk. ?Lung cancer screening. ?Colorectal cancer screening. ?Diabetes screening. This is done by checking your blood sugar (glucose) after you have not eaten for a while (fasting). ?Mammogram. Talk with your health care provider about how often you should have regular mammograms. ?BRCA-related cancer screening. This may be done if you have a family history of breast, ovarian, tubal, or peritoneal cancers. ?Bone density scan. This is done to screen for osteoporosis. ?Talk with your health care provider about your test results, treatment options, and if necessary, the need for more tests. ?Follow these instructions at home: ?Eating and drinking ? ?Eat a diet that includes fresh fruits and vegetables, whole grains, lean protein, and low-fat dairy products. Limit your intake of foods with high amounts of sugar, saturated fats, and salt. ?Take vitamin and mineral supplements as recommended by your health care provider. ?Do not drink alcohol if your health care provider tells you not to drink. ?If you drink alcohol: ?Limit how much you have to 0-1 drink a day. ?Know how much alcohol is in your drink. In the [place] one drink equals one 12 oz bottle of beer (355 mL), one 5 oz glass of wine (148 mL), or one 1? oz glass of hard liquor (44 mL). ?Lifestyle ?Brush your teeth every  morning and night with fluoride toothpaste. Floss one time each day. ?Exercise for at least 30 minutes 5 or more days each week. ?Do not use any products that contain nicotine or tobacco. These products include cigarettes,  chewing tobacco, and vaping devices, such as e-cigarettes. If you need help quitting, ask your health care provider. ?Do not use drugs. ?If you are sexually active, practice safe sex. Use a condom or other form of protection in order to prevent STIs. ?Take aspirin only as told by your health care provider. Make sure that you understand how much to take and what form to take. Work with your health care provider to find out whether it is safe and beneficial for you to take aspirin daily. ?Ask your health care provider if you need to take a cholesterol-lowering medicine (statin). ?Find healthy ways to manage stress, such as: ?Meditation, yoga, or listening to music. ?Journaling. ?Talking to a trusted person. ?Spending time with friends and family. ?Minimize exposure to UV radiation to reduce your risk of skin cancer. ?Safety ?Always wear your seat belt while driving or riding in a vehicle. ?Do not drive: ?If you have been drinking alcohol. Do not ride with someone who has been drinking. ?When you are tired or distracted. ?While texting. ?If you have been using any mind-altering substances or drugs. ?Wear a helmet and other protective equipment during sports activities. ?If you have firearms in your house, make sure you follow all gun safety procedures. ?What's next? ?Visit your health care provider once a year for an annual wellness visit. ?Ask your health care provider how often you should have your eyes and teeth checked. ?Stay up to date on all vaccines. ?This information is not intended to replace advice given to you by your health care provider. Make sure you discuss any questions you have with your health care provider. ?Document Revised: 11/29/2020 Document Reviewed: 11/29/2020 ?Elsevier Patient Education ? [place]. ? ?

## 2021-10-02 ENCOUNTER — Ambulatory Visit (INDEPENDENT_AMBULATORY_CARE_PROVIDER_SITE_OTHER): Payer: Medicare Other | Admitting: Family Medicine

## 2021-10-02 ENCOUNTER — Encounter: Payer: Self-pay | Admitting: Family Medicine

## 2021-10-02 VITALS — BP 148/92 | HR 66 | Temp 98.4°F | Resp 16 | Ht 62.0 in | Wt 224.9 lb

## 2021-10-02 DIAGNOSIS — Z0000 Encounter for general adult medical examination without abnormal findings: Secondary | ICD-10-CM | POA: Diagnosis not present

## 2021-10-02 DIAGNOSIS — Z59819 Housing instability, housed unspecified: Secondary | ICD-10-CM | POA: Diagnosis not present

## 2021-10-02 DIAGNOSIS — I708 Atherosclerosis of other arteries: Secondary | ICD-10-CM

## 2021-10-02 DIAGNOSIS — Z23 Encounter for immunization: Secondary | ICD-10-CM

## 2021-10-02 DIAGNOSIS — Z6841 Body Mass Index (BMI) 40.0 and over, adult: Secondary | ICD-10-CM

## 2021-10-02 DIAGNOSIS — I10 Essential (primary) hypertension: Secondary | ICD-10-CM | POA: Diagnosis not present

## 2021-10-02 DIAGNOSIS — E782 Mixed hyperlipidemia: Secondary | ICD-10-CM | POA: Diagnosis not present

## 2021-10-02 DIAGNOSIS — I700 Atherosclerosis of aorta: Secondary | ICD-10-CM | POA: Diagnosis not present

## 2021-10-02 DIAGNOSIS — F430 Acute stress reaction: Secondary | ICD-10-CM

## 2021-10-02 DIAGNOSIS — Z1231 Encounter for screening mammogram for malignant neoplasm of breast: Secondary | ICD-10-CM

## 2021-10-02 MED ORDER — ZOSTER VAC RECOMB ADJUVANTED 50 MCG/0.5ML IM SUSR: 0.5000 mL | Freq: Once | INTRAMUSCULAR | 1 refills | Status: AC

## 2021-10-02 NOTE — Progress Notes (Signed)
? ? ?Patient: Judith Olson, Female    DOB: 19-Aug-1950, 71 y.o.   MRN: [id number] ?Delsa Grana, PA-C ?Visit Date: 10/02/2021 ? ?Today's Provider: Delsa Grana, PA-C  ? ?Chief Complaint  ?Patient presents with  ? Annual Exam  ? ?Subjective:  ? ?Annual physical exam: ? ?Judith Olson is a 71 y.o. female who presents today for complete physical exam: ? ?Exercise/Activity:  just got back to the gym, gained some weight over winter ?Diet/nutrition:  healthy cooking at home ?Sleep:  no concerns ? ?SDOH Screenings  ? ?Alcohol Screen: Low Risk   ? Last Alcohol Screening Score (AUDIT): 0  ?Depression (PHQ2-9): Low Risk   ? PHQ-2 Score: 0  ?Financial Resource Strain: Low Risk   ? Difficulty of Paying Living Expenses: Not very hard  ?Food Insecurity: Food Insecurity Present  ? Worried About Charity fundraiser in the Last Year: Sometimes true  ? Ran Out of Food in the Last Year: Sometimes true  ?Housing: Low Risk   ? Last Housing Risk Score: 0  ?Physical Activity: Inactive  ? Days of Exercise per Week: 0 days  ? Minutes of Exercise per Session: 10 min  ?Social Connections: Moderately Isolated  ? Frequency of Communication with Friends and Family: More than three times a week  ? Frequency of Social Gatherings with Friends and Family: Once a week  ? Attends Religious Services: 1 to 4 times per year  ? Active Member of Clubs or Organizations: No  ? Attends Archivist Meetings: Never  ? Marital Status: Divorced  ?Stress: No Stress Concern Present  ? Feeling of Stress : Not at all  ?Tobacco Use: Medium Risk  ? Smoking Tobacco Use: Former  ? Smokeless Tobacco Use: Never  ? Passive Exposure: Not on file  ?Transportation Needs: No Transportation Needs  ? Lack of Transportation (Medical): No  ? Lack of Transportation (Non-Medical): No  ? ? ? ?USPSTF grade A and B recommendations - reviewed and addressed today ? ?Depression:  ?Phq 9 completed today by patient, was reviewed by me with patient in the room ?PHQ score is neg, pt  feels good ? ?  10/02/2021  ? 10:51 AM 10/31/2020  ?  2:07 PM 08/15/2020  ?  8:36 AM 04/20/2020  ?  1:47 PM  ?PHQ 2/9 Scores  ?PHQ - 2 Score 0 0 0 0  ?PHQ- 9 Score 0 0    ? ? ?  10/02/2021  ? 10:51 AM 10/31/2020  ?  2:07 PM 08/15/2020  ?  8:36 AM 04/20/2020  ?  1:47 PM 03/09/2020  ? 11:31 AM  ?Depression screen PHQ 2/9  ?Decreased Interest 0 0 0 0 0  ?Down, Depressed, Hopeless 0 0 0 0 0  ?PHQ - 2 Score 0 0 0 0 0  ?Altered sleeping 0 0     ?Tired, decreased energy 0 0     ?Change in appetite 0 0     ?Feeling bad or failure about yourself  0 0     ?Trouble concentrating 0 0     ?Moving slowly or fidgety/restless 0 0     ?Suicidal thoughts 0 0     ?PHQ-9 Score 0 0     ?Difficult doing work/chores Not difficult at all Not difficult at all     ? ? ?Alcohol screening: ?[place] Office Visit from 10/02/2021 in [facility]  ?AUDIT-C Score 0  ? ?  ? ? ?Immunizations and Health Maintenance: ?Health Maintenance  ?Topic  Date Due  ? Zoster Vaccines- Shingrix (1 of 2) Never done  ? MAMMOGRAM  06/28/2021  ? INFLUENZA VACCINE  01/15/2022  ? DEXA SCAN  06/28/2022  ? COLONOSCOPY (Pts 45-52yr Insurance coverage will need to be confirmed)  06/08/2023  ? TETANUS/TDAP  08/13/2026  ? Pneumonia Vaccine 69 Years old  Completed  ? COVID-19 Vaccine  Completed  ? Hepatitis C Screening  Completed  ? HPV VACCINES  Aged Out  ?  ? ?Hep C Screening: done ? ?STD testing and prevention (HIV/chl/gon/syphilis):  see above, no additional testing desired by pt today, denies, states not sexually active ? ?Intimate partner violence:  safe at home ? ?Sexual History/Pain during Intercourse: Divorced ? ?Menstrual History/LMP/Abnormal Bleeding:  none, denies  ?No LMP recorded. Patient has had a hysterectomy. ? ?Incontinence Symptoms:  none ? ?Breast cancer:  due - ordered ?Last Mammogram: *see HM list above ?BRCA gene screening: none, known ?Sister died of breast cancer  ? ?Cervical cancer screening: aged out, low risk ?Pt denies family hx of  cancers -  ovarian, uterine, colon ? ?Osteoporosis:   ?Discussion on osteoporosis per age, including high calcium and vitamin D supplementation, weight bearing exercises ?Pt is  supplementing with daily calcium/Vit D. ?Due next year - Bone scan/dexa ? ?Skin cancer:  Hx of skin CA -  NO   ?Discussed atypical lesions  ? ?Colorectal cancer:   ?Colonoscopy is due 2024 ?Discussed concerning signs and sx of CRC, pt denies change in bowels  ? ?Lung cancer:   ?Low Dose CT Chest recommended if Age 69-80years, 20 pack-year currently smoking OR have quit w/in 15years. Patient does not qualify.   ? ?Social History  ? ?Tobacco Use  ? Smoking status: Former  ?  Types: Cigarettes  ? Smokeless tobacco: Never  ?Vaping Use  ? Vaping Use: Never used  ?Substance Use Topics  ? Alcohol use: Yes  ?  Alcohol/week: 0.0 standard drinks  ?  Comment: rarely  ? Drug use: No  ?  Comment: tried marijuana in the past  ?  ? [facility] [facility] ?AUDIT-C Score 0  ? ?  ? ? ?Family History  ?Problem Relation Age of Onset  ? Cancer Father   ?     porstate  ? Cancer Sister   ?     breast  ? Breast cancer Sister   ? Cancer Brother   ?     unknown  ? Hypertension Neg Hx   ?  ? ?Blood pressure/Hypertension: ?BP Readings from Last 3 Encounters:  ?10/02/21 (!) 148/92  ?10/31/20 118/78  ?08/15/20 128/86  ? ? ?Weight/Obesity: ?Wt Readings from Last 3 Encounters:  ?10/02/21 224 lb 14.4 oz (102 kg)  ?10/31/20 216 lb 4.8 oz (98.1 kg)  ?08/15/20 214 lb 8 oz (97.3 kg)  ? ?BMI Readings from Last 3 Encounters:  ?10/02/21 41.13 kg/m?  ?10/31/20 39.56 kg/m?  ?08/15/20 39.23 kg/m?  ?  ? ?Lipids:  ?Lab Results  ?Component Value Date  ? CHOL 137 10/31/2020  ? CHOL 158 10/18/2019  ? CHOL 159 07/12/2019  ? ?Lab Results  ?Component Value Date  ? HDL 55 10/31/2020  ? HDL 65 10/18/2019  ? HDL 38 (L) 07/12/2019  ? ?Lab Results  ?Component Value Date  ? LGateway63 10/31/2020  ? LEast[place]77 10/18/2019  ? LTiger104 (H)  07/12/2019  ? ?Lab Results  ?Component Value Date  ? TRIG 105 10/31/2020  ?  TRIG 80 10/18/2019  ? TRIG 80 07/12/2019  ? ?Lab Results  ?Component Value Date  ? CHOLHDL 2.5 10/31/2020  ? CHOLHDL 2.4 10/18/2019  ? CHOLHDL 4.2 07/12/2019  ? ?No results found for: LDLDIRECT ?Based on the results of lipid panel his/her cardiovascular risk factor ( using [facility] )  in the next 10 years is: ?The 10-year ASCVD risk score (Arnett DK, et al., 2019) is: 10.3% ?  Values used to calculate the score: ?    Age: 78 years ?    Sex: Female ?    Is Non-Hispanic African American: Yes ?    Diabetic: No ?    Tobacco smoker: No ?    Systolic Blood Pressure: 407 mmHg ?    Is BP treated: Yes ?    HDL Cholesterol: 55 mg/dL ?    Total Cholesterol: 137 mg/dL ? ?Glucose:  ?Glucose, Bld  ?Date Value Ref Range Status  ?10/31/2020 89 65 - 99 mg/dL Final  ?  Comment:  ?  . ?           Fasting reference interval ?. ?  ?03/09/2020 81 65 - 99 mg/dL Final  ?  Comment:  ?  . ?           Fasting reference interval ?. ?  ?10/18/2019 85 65 - 99 mg/dL Final  ?  Comment:  ?  . ?           Fasting reference interval ?. ?  ? ? ?Advanced Care Planning:  ?A voluntary discussion about advance care planning including the explanation and discussion of advance directives.   ?Discussed health care proxy and Living will, and the patient was able to identify a health care proxy as granddaughter - Jim Desanctis.   ?Patient does not have a living will at present time.  ? ?Social History ?      ?Social History  ? ?Socioeconomic History  ? Marital status: Divorced  ?  Spouse name: Not on file  ? Number of children: 2  ? Years of education: Not on file  ? Highest education level: High school graduate  ?Occupational History  ? Occupation: retired  ?Tobacco Use  ? Smoking status: Former  ?  Types: Cigarettes  ? Smokeless tobacco: Never  ?Vaping Use  ? Vaping Use: Never used  ?Substance and Sexual Activity  ? Alcohol use: Yes  ?  Alcohol/week: 0.0 standard drinks  ?   Comment: rarely  ? Drug use: No  ?  Comment: tried marijuana in the past  ? Sexual activity: Not Currently  ?Other Topics Concern  ? Not on file  ?Social History Narrative  ? Lives with daughter in [place] for now

## 2021-10-03 ENCOUNTER — Telehealth: Payer: Self-pay | Admitting: *Deleted

## 2021-10-03 LAB — CBC WITH DIFFERENTIAL/PLATELET
Absolute Monocytes: 627 cells/uL (ref 200–950)
Basophils Absolute: 22 cells/uL (ref 0–200)
Basophils Relative: 0.4 %
Eosinophils Absolute: 90 cells/uL (ref 15–500)
Eosinophils Relative: 1.6 %
HCT: 40.6 % (ref 35.0–45.0)
Hemoglobin: 12.6 g/dL (ref 11.7–15.5)
Lymphs Abs: 2632 cells/uL (ref 850–3900)
MCH: 27.3 pg (ref 27.0–33.0)
MCHC: 31 g/dL — ABNORMAL LOW (ref 32.0–36.0)
MCV: 88.1 fL (ref 80.0–100.0)
MPV: 11.4 fL (ref 7.5–12.5)
Monocytes Relative: 11.2 %
Neutro Abs: 2229 cells/uL (ref 1500–7800)
Neutrophils Relative %: 39.8 %
Platelets: 254 10*3/uL (ref 140–400)
RBC: 4.61 10*6/uL (ref 3.80–5.10)
RDW: 14.5 % (ref 11.0–15.0)
Total Lymphocyte: 47 %
WBC: 5.6 10*3/uL (ref 3.8–10.8)

## 2021-10-03 LAB — LIPID PANEL
Cholesterol: 192 mg/dL (ref <200)
HDL: 50 mg/dL (ref >=50)
LDL Cholesterol (Calc): 123 mg/dL (calc) — ABNORMAL HIGH
Non-HDL Cholesterol (Calc): 142 mg/dL (calc) — ABNORMAL HIGH (ref <130)
Total CHOL/HDL Ratio: 3.8 (calc) (ref <5.0)
Triglycerides: 88 mg/dL (ref <150)

## 2021-10-03 LAB — COMPLETE METABOLIC PANEL WITH GFR
AG Ratio: 1.2 (calc) (ref 1.0–2.5)
ALT: 12 U/L (ref 6–29)
AST: 15 U/L (ref 10–35)
Albumin: 4 g/dL (ref 3.6–5.1)
Alkaline phosphatase (APISO): 90 U/L (ref 37–153)
BUN: 12 mg/dL (ref 7–25)
CO2: 27 mmol/L (ref 20–32)
Calcium: 9.3 mg/dL (ref 8.6–10.4)
Chloride: 105 mmol/L (ref 98–110)
Creat: 0.92 mg/dL (ref 0.50–1.05)
Globulin: 3.4 g/dL (calc) (ref 1.9–3.7)
Glucose, Bld: 94 mg/dL (ref 65–99)
Potassium: 4 mmol/L (ref 3.5–5.3)
Sodium: 141 mmol/L (ref 135–146)
Total Bilirubin: 0.5 mg/dL (ref 0.2–1.2)
Total Protein: 7.4 g/dL (ref 6.1–8.1)
eGFR: 68 mL/min/{1.73_m2} (ref >=60)

## 2021-10-03 NOTE — Chronic Care Management (AMB) (Signed)
?  Care Management  ? ?Note ? ?10/03/2021 ?Name: Jaxyn Mestas MRN: [id number] DOB: 10-20-1950 ? ?Epiphany Seltzer is a 71 y.o. year old female who is a primary care patient of Delsa Grana, [place]. I reached out to [name] by phone today offer care coordination services.  ? ?Ms. Bodie was given information about care management services today including:  ?Care management services include personalized support from designated clinical staff supervised by her physician, including individualized plan of care and coordination with other care providers ?24/7 contact phone numbers for assistance for urgent and routine care needs. ?The patient may stop care management services at any time by phone call to the office staff. ? ?Patient agreed to services and verbal consent obtained.  ? ?Follow up plan: ?Telephone appointment with care management team member scheduled for: 10/12/2021 ? ?Theresa Dohrman, CCMA ?Care Guide, Embedded Care Coordination ?[place]  Care Management  ?Direct Dial: [phone] ? ? ?

## 2021-10-08 ENCOUNTER — Encounter: Payer: Self-pay | Admitting: Family Medicine

## 2021-10-12 ENCOUNTER — Telehealth: Payer: Self-pay | Admitting: *Deleted

## 2021-10-12 ENCOUNTER — Telehealth: Payer: Medicare Other

## 2021-10-12 NOTE — Telephone Encounter (Signed)
?  Care Management  ? ?Follow Up Note ? ? ?10/12/2021 ?Name: Judith Olson MRN: [id number] DOB: Aug 30, 1950 ? ? ?Referred by: Delsa Grana, PA-C ?Reason for referral : Care Coordination ? ? ?An unsuccessful telephone outreach was attempted today. The patient was referred to the case management team for assistance with care management and care coordination.  There was no voicemail set up to leave a message. ? ?Follow Up Plan: Telephone follow up appointment with care management team member to be re-scheduled by careguide. ? ?[employer], LCSW ?Clinical Social Worker  ?[facility] Care Management ?[phone] ? ? ?

## 2021-10-17 ENCOUNTER — Telehealth: Payer: Self-pay | Admitting: Family Medicine

## 2021-10-17 NOTE — Telephone Encounter (Signed)
Patient will be in office on 5/18 can she conduct her AWV after she sees PCP if not possible, patient is open to telephone visit.  ?

## 2021-10-17 NOTE — Telephone Encounter (Signed)
Copied from [place] [phone]. Topic: Medicare AWV ?>> Oct 17, 2021  1:11 PM Cher Nakai R wrote: ?Reason for CRM:  ?No answer unable to leave a message for patient to call back and schedule Medicare Annual Wellness Visit (AWV) in office.  ? ?If unable to come into the office for AWV,  please offer to do virtually or by telephone. ? ?Last AWV: 08/15/2020 ? ?Please schedule at anytime with [facility] Health Advisor. ? ?30 minute appointment for Virtual or phone ?45 minute appointment for in office or Initial virtual/phone ? ?Any questions, please contact me at [phone] ?

## 2021-10-18 ENCOUNTER — Telehealth: Payer: Self-pay | Admitting: *Deleted

## 2021-10-18 NOTE — Chronic Care Management (AMB) (Signed)
?  Care Coordination ?Note ? ?10/18/2021 ?Name: Judith Olson MRN: [id number] DOB: January 29, 1951 ? ?Judith Olson is a 71 y.o. year old female who is a primary care patient of Laurell Roof and is actively engaged with the care management team. I reached out to [name] by phone today to assist with re-scheduling an initial visit with the Licensed Clinical Social Worker ? ?Follow up plan: ?Unsuccessful telephone outreach attempt made. A HIPAA compliant phone message was left for the patient providing contact information and requesting a return call.  ? ?Shekia Kuper, CCMA ?Care Guide, Embedded Care Coordination ?[place]  Care Management  ?Direct Dial: [phone] ? ? ?

## 2021-10-23 NOTE — Chronic Care Management (AMB) (Signed)
?  Care Coordination ?Note ? ?10/23/2021 ?Name: Judith Olson MRN: [id number] DOB: 05/25/1951 ? ?Judith Olson is a 71 y.o. year old female who is a primary care patient of Laurell Roof and is actively engaged with the care management team. I reached out to [name] by phone today to assist with re-scheduling an initial visit with the Licensed Clinical Social Worker ? ?Follow up plan: ?2nd Unsuccessful telephone outreach attempt made. A HIPAA compliant phone message was left for the patient providing contact information and requesting a return call.  ? ?Lecil Tapp, CCMA ?Care Guide, Embedded Care Coordination ?[place]  Care Management  ?Direct Dial: [phone] ? ? ?

## 2021-10-28 ENCOUNTER — Other Ambulatory Visit: Payer: Self-pay | Admitting: Family Medicine

## 2021-10-28 DIAGNOSIS — I700 Atherosclerosis of aorta: Secondary | ICD-10-CM

## 2021-10-28 DIAGNOSIS — E782 Mixed hyperlipidemia: Secondary | ICD-10-CM

## 2021-10-30 NOTE — Chronic Care Management (AMB) (Signed)
?  Care Coordination ?Note ? ?10/30/2021 ?Name: Judith Olson MRN: [id number] DOB: February 02, 1951 ? ?[name] is a 71 y.o. year old female who is a primary care patient of Laurell Roof and is actively engaged with the care management team. I reached out to [name] by phone today to assist with re-scheduling an initial visit with the Licensed Clinical Social Worker ? ?Follow up plan: ?We have been unable to make contact with the patient for follow up. The care management team is available to follow up with the patient after provider conversation with the patient regarding recommendation for care management engagement and subsequent re-referral to the care management team.  ? ?Willer Osorno, CCMA ?Care Guide, Embedded Care Coordination ?[place]  Care Management  ?Direct Dial: [phone] ? ? ?

## 2021-11-01 ENCOUNTER — Ambulatory Visit (INDEPENDENT_AMBULATORY_CARE_PROVIDER_SITE_OTHER): Payer: Medicare Other | Admitting: Family Medicine

## 2021-11-01 ENCOUNTER — Encounter: Payer: Self-pay | Admitting: Family Medicine

## 2021-11-01 VITALS — BP 124/76 | HR 85 | Resp 16 | Ht 62.0 in | Wt 225.0 lb

## 2021-11-01 DIAGNOSIS — I700 Atherosclerosis of aorta: Secondary | ICD-10-CM

## 2021-11-01 DIAGNOSIS — Z6841 Body Mass Index (BMI) 40.0 and over, adult: Secondary | ICD-10-CM

## 2021-11-01 DIAGNOSIS — F430 Acute stress reaction: Secondary | ICD-10-CM | POA: Insufficient documentation

## 2021-11-01 DIAGNOSIS — H40033 Anatomical narrow angle, bilateral: Secondary | ICD-10-CM | POA: Diagnosis not present

## 2021-11-01 DIAGNOSIS — I10 Essential (primary) hypertension: Secondary | ICD-10-CM

## 2021-11-01 DIAGNOSIS — I708 Atherosclerosis of other arteries: Secondary | ICD-10-CM | POA: Diagnosis not present

## 2021-11-01 DIAGNOSIS — E782 Mixed hyperlipidemia: Secondary | ICD-10-CM

## 2021-11-01 NOTE — Assessment & Plan Note (Signed)
Pt taking meds again, BP at goal today Continue losartan-HCTZ daily, will recheck labs in about 3 months Continue DASH

## 2021-11-01 NOTE — Assessment & Plan Note (Signed)
Note given regarding driving limitation

## 2021-11-01 NOTE — Assessment & Plan Note (Signed)
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

## 2021-11-01 NOTE — Assessment & Plan Note (Signed)
Pt restarted statin, no SE or concerns Will recheck lipids in 3 months to see if they are back to goal - if still elevated will increase dose Diet/lifestyle efforts encouraged

## 2021-11-01 NOTE — Patient Instructions (Signed)
In about 3 months come by to do the labs August - labs ordered today To make sure the medications are keeping your cholesterol well controlled

## 2021-11-01 NOTE — Progress Notes (Signed)
Name: Judith Olson   MRN: [id number]    DOB: December 08, 1950   Date:11/01/2021       Progress Note  Chief Complaint  Patient presents with   Follow-up     Subjective:   Judith Olson is a 71 y.o. female, presents to clinic for routine f/up  She did her CPE last month, had not been taking meds, here for recheck on BP and f/up on labs  Hypertension: she started taking meds again, BP well controlled and at goal today Currently managed on losartan-HCTZ 50-12.5 Pt reports good med compliance and denies any SE.   Blood pressure today is well controlled. BP Readings from Last 3 Encounters:  11/01/21 124/76  10/02/21 (!) 148/92  10/31/20 118/78   Pt denies CP, SOB, exertional sx, LE edema, palpitation, Ha's, visual disturbances, lightheadedness, hypotension, syncope.  HLD-  She was off her statin as well, and diet/lifestyle had been much worse than in the past due to many life changes Lab Results  Component Value Date   CHOL 192 10/02/2021   HDL 50 10/02/2021   LDLCALC 123 (H) 10/02/2021   TRIG 88 10/02/2021   CHOLHDL 3.8 10/02/2021   No problems after restarting her statin  Asks for note for department of corrections since she is unable to drive to see her son in prison  - due to anxiety, difficulty with vision on the road - did not do CCM appt     Current Outpatient Medications:    aspirin 81 MG tablet, Take 81 mg by mouth daily., Disp: , Rfl:    atorvastatin (LIPITOR) 10 MG tablet, TAKE 1 TABLET BY MOUTH EVERYDAY AT BEDTIME, Disp: 90 tablet, Rfl: 3   calcium carbonate (OSCAL) 1500 (600 Ca) MG TABS tablet, Take by mouth 2 (two) times daily with a meal., Disp: , Rfl:    cholecalciferol (VITAMIN D) 1000 units tablet, Take 1,000 Units by mouth daily., Disp: , Rfl:    Cyanocobalamin (VITAMIN B 12 PO), Take by mouth., Disp: , Rfl:    dorzolamide-timolol (COSOPT) 22.3-6.8 MG/ML ophthalmic solution, SMARTSIG:In Eye(s), Disp: , Rfl:    latanoprost (XALATAN) 0.005 % ophthalmic  solution, Place 1 drop into the right eye Nightly. , Disp: , Rfl: 5   losartan-hydrochlorothiazide (HYZAAR) 50-12.5 MG tablet, Take 1 tablet by mouth daily., Disp: 90 tablet, Rfl: 3   MegaRed Omega-3 Krill Oil 500 MG CAPS, Take by mouth., Disp: , Rfl:    vitamin C (ASCORBIC ACID) 500 MG tablet, Take 500 mg by mouth daily., Disp: , Rfl:    vitamin E 1000 UNIT capsule, Take 1,000 Units by mouth daily., Disp: , Rfl:   Patient Active Problem List   Diagnosis Date Noted   Anatomical narrow angle, bilateral 11/01/2021   Acute stress reaction 11/01/2021   Mixed hyperlipidemia 10/18/2019   Essential hypertension, benign 08/04/2016   Aorto-iliac atherosclerosis ([place]) 06/06/2016   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   Scoliosis of lumbar spine 06/06/2016   Class 3 severe obesity with serious comorbidity and body mass index (BMI) of 40.0 to 44.9 in adult [facility]) 06/06/2016   Other kyphosis of thoracic region 12/10/2015    Past Surgical History:  Procedure Laterality Date   ABDOMINAL HYSTERECTOMY  2000    Family History  Problem Relation Age of Onset   Cancer Father        porstate   Cancer Sister        breast   Breast cancer Sister    Cancer  Brother        unknown   Hypertension Neg Hx     Social History   Tobacco Use   Smoking status: Former    Types: Cigarettes   Smokeless tobacco: Never  Vaping Use   Vaping Use: Never used  Substance Use Topics   Alcohol use: Yes    Alcohol/week: 0.0 standard drinks    Comment: rarely   Drug use: No    Comment: tried marijuana in the past     No Known Allergies  Health Maintenance  Topic Date Due   Zoster Vaccines- Shingrix (1 of 2) Never done   MAMMOGRAM  06/28/2021   INFLUENZA VACCINE  01/15/2022   DEXA SCAN  06/28/2022   COLONOSCOPY (Pts 45-63yr Insurance coverage will need to be confirmed)  06/08/2023   TETANUS/TDAP  08/13/2026   Pneumonia Vaccine 71 Years old  Completed   COVID-19 Vaccine  Completed    Hepatitis C Screening  Completed   HPV VACCINES  Aged Out    Chart Review Today: I personally reviewed active problem list, medication list, allergies, family history, social history, health maintenance, notes from last encounter, lab results, imaging with the patient/caregiver today.   Review of Systems  Constitutional: Negative.   HENT: Negative.    Eyes: Negative.   Respiratory: Negative.    Cardiovascular: Negative.   Gastrointestinal: Negative.   Endocrine: Negative.   Genitourinary: Negative.   Musculoskeletal: Negative.   Skin: Negative.   Allergic/Immunologic: Negative.   Neurological: Negative.   Hematological: Negative.   Psychiatric/Behavioral: Negative.    All other systems reviewed and are negative.   Objective:   Vitals:   11/01/21 0856  BP: 124/76  Pulse: 85  Resp: 16  SpO2: 98%  Weight: 225 lb (102.1 kg)  Height: '5\' 2"'$  (1.575 m)    Body mass index is 41.15 kg/m.  Physical Exam Vitals and nursing note reviewed.  Constitutional:      General: She is not in acute distress.    Appearance: Normal appearance. She is well-developed. She is obese. She is not ill-appearing, toxic-appearing or diaphoretic.  HENT:     Head: Normocephalic and atraumatic.     Nose: Nose normal.  Eyes:     General:        Right eye: No discharge.        Left eye: No discharge.     Conjunctiva/sclera: Conjunctivae normal.  Neck:     Trachea: No tracheal deviation.  Cardiovascular:     Rate and Rhythm: Normal rate and regular rhythm.     Pulses: Normal pulses.     Heart sounds: Normal heart sounds. No murmur heard.   No friction rub. No gallop.  Pulmonary:     Effort: Pulmonary effort is normal. No respiratory distress.     Breath sounds: Normal breath sounds. No stridor.  Musculoskeletal:        General: Normal range of motion.  Skin:    General: Skin is warm and dry.     Findings: No rash.  Neurological:     Mental Status: She is alert.     Motor: No abnormal  muscle tone.     Coordination: Coordination normal.  Psychiatric:        Mood and Affect: Mood normal.        Behavior: Behavior normal.        Assessment & Plan:   Problem List Items Addressed This Visit       Cardiovascular and Mediastinum  Aorto-iliac atherosclerosis (HCC) (Chronic)    Back on statin, monitoring       Relevant Orders   COMPLETE METABOLIC PANEL WITH GFR   Lipid panel   Essential hypertension, benign (Chronic)    Pt taking meds again, BP at goal today Continue losartan-HCTZ daily, will recheck labs in about 3 months Continue DASH       Relevant Orders   COMPLETE METABOLIC PANEL WITH GFR     Other   Class 3 severe obesity with serious comorbidity and body mass index (BMI) of 40.0 to 44.9 in adult [facility])   Mixed hyperlipidemia - Primary    Pt restarted statin, no SE or concerns Will recheck lipids in 3 months to see if they are back to goal - if still elevated will increase dose Diet/lifestyle efforts encouraged       Relevant Orders   COMPLETE METABOLIC PANEL WITH GFR   Lipid panel   Anatomical narrow angle, bilateral    Note given regarding driving limitation       Acute stress reaction    Encouraged her to connect with CCM SW 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   Delsa Grana, PA-C 11/01/21 11:49 AM

## 2021-11-01 NOTE — Assessment & Plan Note (Signed)
Back on statin, monitoring

## 2021-11-26 ENCOUNTER — Telehealth: Payer: Self-pay | Admitting: Family Medicine

## 2021-11-26 NOTE — Telephone Encounter (Signed)
Copied from [place] [id number]. Topic: Medicare AWV >> Nov 26, 2021 11:26 AM Jae Dire wrote: Reason for CRM:  Left message for patient to call back and schedule Medicare Annual Wellness Visit (AWV) in office.   If unable to come into the office for AWV,  please offer to do virtually or by telephone.  Last AWV: 08/15/2020  Please schedule at anytime with [place].  30 minute appointment for Virtual or phone 45 minute appointment for in office or Initial virtual/phone  Any questions, please contact me at [phone]

## 2021-11-28 ENCOUNTER — Other Ambulatory Visit: Payer: Self-pay | Admitting: Family Medicine

## 2021-11-28 DIAGNOSIS — I10 Essential (primary) hypertension: Secondary | ICD-10-CM

## 2022-06-12 DIAGNOSIS — H40003 Preglaucoma, unspecified, bilateral: Secondary | ICD-10-CM | POA: Diagnosis not present

## 2022-08-06 ENCOUNTER — Telehealth: Payer: Self-pay | Admitting: Family Medicine

## 2022-08-06 NOTE — Telephone Encounter (Signed)
Called patient to schedule Medicare Annual Wellness Visit (AWV). Left message for patient to call back and schedule Medicare Annual Wellness Visit (AWV).  Last date of AWV: 08/15/2020  Please schedule an appointment at any time with [facility].  If any questions, please contact me at [phone].  Thank you ,  [place] Direct Dial: [phone]

## 2022-09-10 DIAGNOSIS — H402213 Chronic angle-closure glaucoma, right eye, severe stage: Secondary | ICD-10-CM | POA: Diagnosis not present

## 2022-09-19 DIAGNOSIS — H40033 Anatomical narrow angle, bilateral: Secondary | ICD-10-CM | POA: Diagnosis not present

## 2022-09-19 DIAGNOSIS — H2513 Age-related nuclear cataract, bilateral: Secondary | ICD-10-CM | POA: Diagnosis not present

## 2022-11-14 ENCOUNTER — Other Ambulatory Visit: Payer: Self-pay | Admitting: Family Medicine

## 2022-11-14 DIAGNOSIS — I708 Atherosclerosis of other arteries: Secondary | ICD-10-CM

## 2022-11-14 DIAGNOSIS — E782 Mixed hyperlipidemia: Secondary | ICD-10-CM

## 2022-11-25 ENCOUNTER — Encounter: Payer: Self-pay | Admitting: Family Medicine

## 2022-11-25 ENCOUNTER — Ambulatory Visit (INDEPENDENT_AMBULATORY_CARE_PROVIDER_SITE_OTHER): Payer: Medicare Other | Admitting: Family Medicine

## 2022-11-25 ENCOUNTER — Other Ambulatory Visit: Payer: Self-pay | Admitting: Family Medicine

## 2022-11-25 ENCOUNTER — Other Ambulatory Visit: Payer: Self-pay

## 2022-11-25 VITALS — BP 138/80 | HR 72 | Temp 97.9°F | Resp 16 | Ht 62.0 in | Wt 210.1 lb

## 2022-11-25 DIAGNOSIS — R7301 Impaired fasting glucose: Secondary | ICD-10-CM | POA: Diagnosis not present

## 2022-11-25 DIAGNOSIS — E782 Mixed hyperlipidemia: Secondary | ICD-10-CM | POA: Diagnosis not present

## 2022-11-25 DIAGNOSIS — Z0000 Encounter for general adult medical examination without abnormal findings: Secondary | ICD-10-CM | POA: Diagnosis not present

## 2022-11-25 DIAGNOSIS — I10 Essential (primary) hypertension: Secondary | ICD-10-CM | POA: Diagnosis not present

## 2022-11-25 DIAGNOSIS — Z131 Encounter for screening for diabetes mellitus: Secondary | ICD-10-CM | POA: Diagnosis not present

## 2022-11-25 DIAGNOSIS — Z780 Asymptomatic menopausal state: Secondary | ICD-10-CM | POA: Diagnosis not present

## 2022-11-25 DIAGNOSIS — Z1231 Encounter for screening mammogram for malignant neoplasm of breast: Secondary | ICD-10-CM

## 2022-11-25 DIAGNOSIS — I700 Atherosclerosis of aorta: Secondary | ICD-10-CM

## 2022-11-25 DIAGNOSIS — R739 Hyperglycemia, unspecified: Secondary | ICD-10-CM

## 2022-11-25 DIAGNOSIS — I708 Atherosclerosis of other arteries: Secondary | ICD-10-CM

## 2022-11-25 DIAGNOSIS — Z23 Encounter for immunization: Secondary | ICD-10-CM

## 2022-11-25 LAB — CBC WITH DIFFERENTIAL/PLATELET
Absolute Monocytes: 507 cells/uL (ref 200–950)
Basophils Absolute: 23 cells/uL (ref 0–200)
Eosinophils Absolute: 120 cells/uL (ref 15–500)
Hemoglobin: 12.1 g/dL (ref 11.7–15.5)
MCV: 87.3 fL (ref 80.0–100.0)
Platelets: 222 10*3/uL (ref 140–400)
WBC: 5.7 10*3/uL (ref 3.8–10.8)

## 2022-11-25 NOTE — Patient Instructions (Signed)
Ms. Judith Olson , Thank you for taking time to come for your Medicare Wellness Visit. I appreciate your ongoing commitment to your health goals. Please review the following plan we discussed and let me know if I can assist you in the future.   These are the goals we discussed:  Goals      Exercise 150 min/wk Moderate Activity     She is working on going to the gym and working with a trainer there on work outs and nutrition        This is a list of the screening recommended for you and due dates:  Health Maintenance  Topic Date Due   Mammogram  06/28/2021   Medicare Annual Wellness Visit  08/15/2021 - done and will update today   Zoster (Shingles) Vaccine (2 of 2) 04/05/2022   DEXA scan (bone density measurement)  06/28/2022   COVID-19 Vaccine (4 - 2023-24 season) 12/11/2022*   Flu Shot  01/16/2023   Colon Cancer Screening  06/08/2023   DTaP/Tdap/Td vaccine (2 - Td or Tdap) 08/13/2026   Pneumonia Vaccine  Completed   Hepatitis C Screening  Completed   HPV Vaccine  Aged Out  *Topic was postponed. The date shown is not the original due date.     Preventive Care 13 Years and Older, Female Preventive care refers to lifestyle choices and visits with your health care provider that can promote health and wellness. Preventive care visits are also called wellness exams. What can I expect for my preventive care visit? Counseling Your health care provider may ask you questions about your: Medical history, including: Past medical problems. Family medical history. Pregnancy and menstrual history. History of falls. Current health, including: Memory and ability to understand (cognition). Emotional well-being. Home life and relationship well-being. Sexual activity and sexual health. Lifestyle, including: Alcohol, nicotine or tobacco, and drug use. Access to firearms. Diet, exercise, and sleep habits. Work and work Astronomer. Sunscreen use. Safety issues such as seatbelt and bike helmet  use. Physical exam Your health care provider will check your: Height and weight. These may be used to calculate your BMI (body mass index). BMI is a measurement that tells if you are at a healthy weight. Waist circumference. This measures the distance around your waistline. This measurement also tells if you are at a healthy weight and may help predict your risk of certain diseases, such as type 2 diabetes and high blood pressure. Heart rate and blood pressure. Body temperature. Skin for abnormal spots. What immunizations do I need?  Vaccines are usually given at various ages, according to a schedule. Your health care provider will recommend vaccines for you based on your age, medical history, and lifestyle or other factors, such as travel or where you work. What tests do I need? Screening Your health care provider may recommend screening tests for certain conditions. This may include: Lipid and cholesterol levels. Hepatitis C test. Hepatitis B test. HIV (human immunodeficiency virus) test. STI (sexually transmitted infection) testing, if you are at risk. Lung cancer screening. Colorectal cancer screening. Diabetes screening. This is done by checking your blood sugar (glucose) after you have not eaten for a while (fasting). Mammogram. Talk with your health care provider about how often you should have regular mammograms. BRCA-related cancer screening. This may be done if you have a family history of breast, ovarian, tubal, or peritoneal cancers. Bone density scan. This is done to screen for osteoporosis. Talk with your health care provider about your test results, treatment options,  and if necessary, the need for more tests. Follow these instructions at home: Eating and drinking  Eat a diet that includes fresh fruits and vegetables, whole grains, lean protein, and low-fat dairy products. Limit your intake of foods with high amounts of sugar, saturated fats, and salt. Take vitamin and  mineral supplements as recommended by your health care provider. Do not drink alcohol if your health care provider tells you not to drink. If you drink alcohol: Limit how much you have to 0-1 drink a day. Know how much alcohol is in your drink. In the [place] one drink equals one 12 oz bottle of beer (355 mL), one 5 oz glass of wine (148 mL), or one 1 oz glass of hard liquor (44 mL). Lifestyle Brush your teeth every morning and night with fluoride toothpaste. Floss one time each day. Exercise for at least 30 minutes 5 or more days each week. Do not use any products that contain nicotine or tobacco. These products include cigarettes, chewing tobacco, and vaping devices, such as e-cigarettes. If you need help quitting, ask your health care provider. Do not use drugs. If you are sexually active, practice safe sex. Use a condom or other form of protection in order to prevent STIs. Take aspirin only as told by your health care provider. Make sure that you understand how much to take and what form to take. Work with your health care provider to find out whether it is safe and beneficial for you to take aspirin daily. Ask your health care provider if you need to take a cholesterol-lowering medicine (statin). Find healthy ways to manage stress, such as: Meditation, yoga, or listening to music. Journaling. Talking to a trusted person. Spending time with friends and family. Minimize exposure to UV radiation to reduce your risk of skin cancer. Safety Always wear your seat belt while driving or riding in a vehicle. Do not drive: If you have been drinking alcohol. Do not ride with someone who has been drinking. When you are tired or distracted. While texting. If you have been using any mind-altering substances or drugs. Wear a helmet and other protective equipment during sports activities. If you have firearms in your house, make sure you follow all gun safety procedures. What's next? Visit your  health care provider once a year for an annual wellness visit. Ask your health care provider how often you should have your eyes and teeth checked. Stay up to date on all vaccines. This information is not intended to replace advice given to you by your health care provider. Make sure you discuss any questions you have with your health care provider. Document Revised: 11/29/2020 Document Reviewed: 11/29/2020 Elsevier Patient Education  2024 [employer].

## 2022-11-25 NOTE — Progress Notes (Signed)
Subjective:   Judith Olson is a 72 y.o. female who presents for Medicare Annual (Subsequent) preventive examination.  Review of Systems    See CPE - no concerning sx currently noted Cardiac Risk Factors include: advanced age (>11men, >53 women);dyslipidemia;hypertension     Objective:    Today's Vitals   11/25/22 1347  BP: 138/80  Pulse: 72  Resp: 16  Temp: 97.9 F (36.6 C)  TempSrc: Oral  SpO2: 99%  Weight: 210 lb 1.6 oz (95.3 kg)  Height: 5\' 2"  (1.575 m)   Body mass index is 38.43 kg/m.     11/25/2022    6:20 PM 08/15/2020    8:37 AM 01/15/2019    2:31 PM 02/10/2017    4:01 PM 11/15/2016    1:49 PM 08/13/2016    3:59 PM 07/16/2016    3:20 PM  Advanced Directives  Does Patient Have a Medical Advance Directive? No No No No No No No  Would patient like information on creating a medical advance directive? Yes (MAU/Ambulatory/Procedural Areas - Information given) Yes (MAU/Ambulatory/Procedural Areas - Information given) Yes (MAU/Ambulatory/Procedural Areas - Information given)    Yes (MAU/Ambulatory/Procedural Areas - Information given)    Current Medications (verified) Outpatient Encounter Medications as of 11/25/2022  Medication Sig   aspirin 81 MG tablet Take 81 mg by mouth daily.   atorvastatin (LIPITOR) 10 MG tablet TAKE 1 TABLET BY MOUTH EVERYDAY AT BEDTIME   calcium carbonate (OSCAL) 1500 (600 Ca) MG TABS tablet Take by mouth 2 (two) times daily with a meal.   cholecalciferol (VITAMIN D) 1000 units tablet Take 1,000 Units by mouth daily.   Cyanocobalamin (VITAMIN B 12 PO) Take by mouth.   dorzolamide-timolol (COSOPT) 22.3-6.8 MG/ML ophthalmic solution SMARTSIG:In Eye(s)   latanoprost (XALATAN) 0.005 % ophthalmic solution Place 1 drop into the right eye Nightly.    losartan-hydrochlorothiazide (HYZAAR) 50-12.5 MG tablet TAKE 1 TABLET BY MOUTH EVERY DAY   MegaRed Omega-3 Krill Oil 500 MG CAPS Take by mouth.   vitamin C (ASCORBIC ACID) 500 MG tablet Take 500 mg by mouth  daily.   vitamin E 1000 UNIT capsule Take 1,000 Units by mouth daily.   No facility-administered encounter medications on file as of 11/25/2022.    Allergies (verified) Patient has no known allergies.   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   Found on imaging Nov 2017   Essential hypertension, benign 08/04/2016   Hyperlipidemia    Hypertension    Muscle spasms of both lower extremities    Obesity 06/06/2016   Scoliosis of lumbar spine 06/06/2016   Past Surgical History:  Procedure Laterality Date   ABDOMINAL HYSTERECTOMY  2000   Family History  Problem Relation Age of Onset   Cancer Father        porstate   Cancer Sister        breast   Breast cancer Sister    Cancer Brother        unknown   Hypertension Neg Hx    Social History   Socioeconomic History   Marital status: Divorced    Spouse name: Not on file   Number of children: 2   Years of education: Not on file   Highest education level: High school graduate  Occupational History   Occupation: retired  Tobacco Use   Smoking  status: Former    Types: Cigarettes   Smokeless tobacco: Never  Vaping Use   Vaping Use: Never used  Substance and Sexual Activity   Alcohol use: Yes    Alcohol/week: 0.0 standard drinks of alcohol    Comment: rarely   Drug use: No    Comment: tried marijuana in the past   Sexual activity: Not Currently  Other Topics Concern   Not on file  Social History Narrative   Lives with daughter in [place] for now; works part time at [employer]   Social Determinants of Health   Financial Resource Strain: Low Risk  (11/25/2022)   Overall Financial Resource Strain (CARDIA)    Difficulty of Paying Living Expenses: Not hard at all  Food Insecurity: Food Insecurity Present (11/25/2022)   Hunger Vital Sign     Worried About Running Out of Food in the Last Year: Sometimes true    Ran Out of Food in the Last Year: Sometimes true  Transportation Needs: No Transportation Needs (11/25/2022)   PRAPARE - Administrator, Civil Service (Medical): No    Lack of Transportation (Non-Medical): No  Physical Activity: Insufficiently Active (11/25/2022)   Exercise Vital Sign    Days of Exercise per Week: 1 day    Minutes of Exercise per Session: 10 min  Stress: No Stress Concern Present (11/25/2022)   [employer] of Occupational Health - Occupational Stress Questionnaire    Feeling of Stress : Only a little  Social Connections: Unknown (11/25/2022)   Social Connection and Isolation Panel [NHANES]    Frequency of Communication with Friends and Family: More than three times a week    Frequency of Social Gatherings with Friends and Family: Three times a week    Attends Religious Services: More than 4 times per year    Active Member of Clubs or Organizations: No    Attends Banker Meetings: Never    Marital Status: Not on file    Tobacco Counseling- not currently smoking Counseling given: Not Answered   Clinical Intake:  Pre-visit preparation completed: No  Pain : No/denies pain     BMI - recorded: 38 Nutritional Status: BMI > 30  Obese Nutritional Risks: None Diabetes: No     Diabetic?no  Interpreter Needed?: No  Information entered by :: LT   Activities of Daily Living    11/25/2022    1:48 PM  In your present state of health, do you have any difficulty performing the following activities:  Hearing? 0  Vision? 0  Difficulty concentrating or making decisions? 0  Walking or climbing stairs? 0  Dressing or bathing? 0  Doing errands, shopping? 0  Preparing Food and eating ? N  Using the Toilet? N  In the past six months, have you accidently leaked urine? N  Do you have problems with loss of bowel control? N  Managing your Medications? N  Housekeeping or  managing your Housekeeping? N    Patient Care Team: Danelle Berry, PA-C as PCP - General (Family Medicine)  Indicate any recent Medical Services you may have received from other than [facility] providers in the past year (date may be approximate).  [facility]  For glaucoma    Assessment:   This is a routine wellness examination for Clair.  Hearing/Vision screen Not done today  Dietary issues and exercise activities discussed: Current Exercise Habits: Home exercise routine;Structured exercise class, Type of exercise: strength training/weights;treadmill;walking, Time (Minutes): 60, Frequency (Times/Week): 7, Weekly  Exercise (Minutes/Week): 420, Exercise limited by: None identified   Goals Addressed             This Visit's Progress    Exercise 150 min/wk Moderate Activity       She is working on going to the gym and working with a trainer there on work outs and nutrition       Depression Screen    11/25/2022    1:49 PM 11/01/2021    8:57 AM 10/02/2021   10:51 AM 10/31/2020    2:07 PM 08/15/2020    8:36 AM 04/20/2020    1:47 PM 03/09/2020   11:31 AM  PHQ 2/9 Scores  PHQ - 2 Score 0 0 0 0 0 0 0  PHQ- 9 Score  0 0 0       Fall Risk    11/25/2022    1:48 PM 11/01/2021    8:57 AM 10/02/2021   10:51 AM 10/31/2020    2:07 PM 08/15/2020    8:41 AM  Fall Risk   Falls in the past year? 0 0 0 0 0  Number falls in past yr: 0 0 0 0 0  Injury with Fall? 0 0 0 0 0  Risk for fall due to :  No Fall Risks No Fall Risks  No Fall Risks  Follow up  Falls prevention discussed Education provided;Falls prevention discussed  Falls prevention discussed    FALL RISK PREVENTION PERTAINING TO THE HOME:  Any stairs in or around the home? Yes  If so, are there any without handrails? No  Home free of loose throw rugs in walkways, pet beds, electrical cords, etc? Yes  Adequate lighting in your home to reduce risk of falls? Yes   ASSISTIVE DEVICES UTILIZED TO PREVENT  FALLS:  Life alert? No  Use of a cane, walker or w/c? No    TIMED UP AND GO:  Was the test performed? Yes .  Length of time to ambulate 10 feet: 5sec sec.   Gait steady and fast without use of assistive device  Cognitive Function:  Cognitive Testing   Alert? Yes  Normal Appearance?Yes  Oriented to person? Yes  Place? Yes  Time? Yes  Recall of three objects? Not done today  Can perform simple calculations? Yes  Displays appropriate judgment?Yes      07/12/2019    4:19 PM  MMSE - Mini Mental State Exam  Orientation to time 5  Orientation to Place 4  Registration 3  Attention/ Calculation 0  Recall 2  Language- name 2 objects 2  Language- repeat 1  Language- follow 3 step command 3  Language- read & follow direction 1  Write a sentence 1  Copy design 1  Total score 23        01/15/2019    2:37 PM  6CIT Screen  What Year? 0 points  What month? 0 points  What time? 0 points  Count back from 20 0 points  Months in reverse 0 points  Repeat phrase 2 points  Total Score 2 points    Immunizations Immunization History  Administered Date(s) Administered   Influenza, High Dose Seasonal PF 05/14/2021   Influenza,inj,quad, With Preservative 03/07/2020   Influenza-Unspecified 03/17/2018, 03/07/2020, 05/14/2021   PFIZER(Purple Top)SARS-COV-2 Vaccination 01/18/2020, 03/07/2020   Pfizer Covid-19 Vaccine Bivalent Booster 92yrs & up 05/14/2021   Pneumococcal Conjugate-13 01/15/2019   Pneumococcal Polysaccharide-23 04/20/2020   Tdap 08/13/2016   Zoster Recombinat (Shingrix) 02/08/2022   Zoster, Live 11/14/2015  TDAP status: Up to date  Flu Vaccine status: Up to date  Pneumococcal vaccine status: Up to date  Covid-19 vaccine status: Declined, Education has been provided regarding the importance of this vaccine but patient still declined. Advised may receive this vaccine at local pharmacy or Health Dept.or vaccine clinic. Aware to provide a copy of the  vaccination record if obtained from local pharmacy or Health Dept. Verbalized acceptance and understanding.  Qualifies for Shingles Vaccine? Yes   Zostavax completed No   Shingrix Completed?: No.    Education has been provided regarding the importance of this vaccine. Patient has been advised to call insurance company to determine out of pocket expense if they have not yet received this vaccine. Advised may also receive vaccine at local pharmacy or Health Dept. Verbalized acceptance and understanding.  Screening Tests Health Maintenance  Topic Date Due   MAMMOGRAM  06/28/2021   Medicare Annual Wellness (AWV)  08/15/2021   Zoster Vaccines- Shingrix (2 of 2) 04/05/2022   DEXA SCAN  06/28/2022   COVID-19 Vaccine (4 - 2023-24 season) 12/11/2022 (Originally 02/15/2022)   INFLUENZA VACCINE  01/16/2023   Colonoscopy  06/08/2023   DTaP/Tdap/Td (2 - Td or Tdap) 08/13/2026   Pneumonia Vaccine 54+ Years old  Completed   Hepatitis C Screening  Completed   HPV VACCINES  Aged Out    Health Maintenance  Health Maintenance Due  Topic Date Due   MAMMOGRAM  06/28/2021   Medicare Annual Wellness (AWV)  08/15/2021   Zoster Vaccines- Shingrix (2 of 2) 04/05/2022   DEXA SCAN  06/28/2022    Colorectal cancer screening: Type of screening: Colonoscopy. due 05/2023  Mammogram status: Ordered due. Pt provided with contact info and advised to call to schedule appt.   Bone Density status: Ordered due. Pt provided with contact info and advised to call to schedule appt.  Lung Cancer Screening: (Low Dose CT Chest recommended if Age 54-80 years, 30 pack-year currently smoking OR have quit w/in 15years.) does not qualify.   Lung Cancer Screening Referral: n/a  Additional Screening:  Hepatitis C Screening: done  Vision Screening: Recommended annual ophthalmology exams for early detection of glaucoma and other disorders of the eye. Is the patient up to date with their annual eye exam?  Yes  Who is the  provider or what is the name of the office in which the patient attends annual eye exams? Dr. Brooke Dare   Dental Screening: Recommended annual dental exams for proper oral hygiene  Community Resource Referral / Chronic Care Management: CRR required this visit?  No   CCM required this visit?  No      Plan:     I have personally reviewed and noted the following in the patient's chart:   Medical and social history Use of alcohol, tobacco or illicit drugs  Current medications and supplements including opioid prescriptions. Patient is not currently taking opioid prescriptions. Functional ability and status Nutritional status Physical activity Advanced directives List of other physicians Hospitalizations, surgeries, and ER visits in previous 12 months Vitals Screenings to include cognitive, depression, and falls Referrals and appointments  In addition, I have reviewed and discussed with patient certain preventive protocols, quality metrics, and best practice recommendations. A written personalized care plan for preventive services as well as general preventive health recommendations were provided to patient.     Danelle Berry, PA-C   11/25/2022

## 2022-11-25 NOTE — Progress Notes (Signed)
Patient: Judith Olson, Female    DOB: 01/01/1951, 72 y.o.   MRN: [id number] Danelle Berry, PA-C Visit Date: 11/25/2022  Today's Provider: Danelle Berry, PA-C   Chief Complaint  Patient presents with   Annual Exam   Subjective:   Annual physical exam:  Judith Olson is a 72 y.o. female who presents today for complete physical exam:  Exercise/Activity:  minimal exercise - one day a week Diet/nutrition:  has a trainer at [employer] and working on healthy exercise and diet  Sleep:  good, no concerns New job that she loves, part time,   SDOH Screenings   Food Insecurity: Food Insecurity Present (10/02/2021)  Housing: Low Risk  (11/25/2022)  Transportation Needs: No Transportation Needs (10/02/2021)  Alcohol Screen: Low Risk  (11/25/2022)  Depression (PHQ2-9): Low Risk  (11/25/2022)  Financial Resource Strain: Low Risk  (10/02/2021)  Physical Activity: Inactive (10/02/2021)  Social Connections: Moderately Isolated (10/02/2021)  Stress: No Stress Concern Present (10/02/2021)  Tobacco Use: Medium Risk (11/25/2022)      USPSTF grade A and B recommendations - reviewed and addressed today  Depression:  Phq 9 completed today by patient, was reviewed by me with patient in the room PHQ score is negative, pt feels good- pt moved in with youngest son and she's enjoying her grandkids    11/25/2022    1:49 PM 11/01/2021    8:57 AM 10/02/2021   10:51 AM 10/31/2020    2:07 PM  PHQ 2/9 Scores  PHQ - 2 Score 0 0 0 0  PHQ- 9 Score  0 0 0      11/25/2022    1:49 PM 11/01/2021    8:57 AM 10/02/2021   10:51 AM 10/31/2020    2:07 PM 08/15/2020    8:36 AM  Depression screen PHQ 2/9  Decreased Interest 0 0 0 0 0  Down, Depressed, Hopeless 0 0 0 0 0  PHQ - 2 Score 0 0 0 0 0  Altered sleeping  0 0 0   Tired, decreased energy  0 0 0   Change in appetite  0 0 0   Feeling bad or failure about yourself   0 0 0   Trouble concentrating  0 0 0   Moving slowly or fidgety/restless  0 0 0   Suicidal  thoughts  0 0 0   PHQ-9 Score  0 0 0   Difficult doing work/chores   Not difficult at all Not difficult at all     Alcohol screening: Flowsheet Row Office Visit from 11/25/2022 in [facility]  AUDIT-C Score 0      Immunizations and Health Maintenance: Health Maintenance  Topic Date Due   MAMMOGRAM  06/28/2021   Medicare Annual Wellness (AWV)  08/15/2021   COVID-19 Vaccine (4 - 2023-24 season) 02/15/2022   Zoster Vaccines- Shingrix (2 of 2) 04/05/2022   DEXA SCAN  06/28/2022   INFLUENZA VACCINE  01/16/2023   Colonoscopy  06/08/2023   DTaP/Tdap/Td (2 - Td or Tdap) 08/13/2026   Pneumonia Vaccine 64+ Years old  Completed   Hepatitis C Screening  Completed   HPV VACCINES  Aged Out    Hep C Screening: done  STD testing and prevention (HIV/chl/gon/syphilis):  see above, no additional testing desired by pt today  Intimate partner violence: feels safe Lives with youngest son  Sexual History/Pain during Intercourse: no need, no exposure Divorced last sex was 2017  Menstrual History/LMP/Abnormal Bleeding:  AUB No LMP recorded. Patient  has had a hysterectomy.  Incontinence Symptoms: none  Breast cancer:  due Last Mammogram: *see HM list above BRCA gene screening:   Cervical cancer screening: aged out  Osteoporosis:   Discussion on osteoporosis per age, including high calcium and vitamin D supplementation, weight bearing exercises Pt is supplementing with daily calcium/Vit D. DUE - Bone scan/dexa  Skin cancer:  Hx of skin CA -  NO Discussed atypical lesions   Colorectal cancer:   Colonoscopy is due Dec this year 2024 Discussed concerning signs and sx of CRC, pt denies   Lung cancer:   Low Dose CT Chest recommended if Age 20-80 years, 20 pack-year currently smoking OR have quit w/in 15years. Patient does not qualify.    Social History   Tobacco Use   Smoking status: Former    Types: Cigarettes   Smokeless tobacco: Never  Vaping Use    Vaping Use: Never used  Substance Use Topics   Alcohol use: Yes    Alcohol/week: 0.0 standard drinks of alcohol    Comment: rarely   Drug use: No    Comment: tried marijuana in the past     [employer] Visit from 11/25/2022 in [facility]  AUDIT-C Score 0       Family History  Problem Relation Age of Onset   Cancer Father        porstate   Cancer Sister        breast   Breast cancer Sister    Cancer Brother        unknown   Hypertension Neg Hx      Blood pressure/Hypertension: BP Readings from Last 3 Encounters:  11/25/22 138/80  11/01/21 124/76  10/02/21 (!) 148/92    Weight/Obesity: Wt Readings from Last 3 Encounters:  11/25/22 210 lb 1.6 oz (95.3 kg)  11/01/21 225 lb (102.1 kg)  10/02/21 224 lb 14.4 oz (102 kg)   BMI Readings from Last 3 Encounters:  11/25/22 38.43 kg/m  11/01/21 41.15 kg/m  10/02/21 41.13 kg/m     Lipids:  Lab Results  Component Value Date   CHOL 192 10/02/2021   CHOL 137 10/31/2020   CHOL 158 10/18/2019   Lab Results  Component Value Date   HDL 50 10/02/2021   HDL 55 10/31/2020   HDL 65 10/18/2019   Lab Results  Component Value Date   LDLCALC 123 (H) 10/02/2021   LDLCALC 63 10/31/2020   LDLCALC 77 10/18/2019   Lab Results  Component Value Date   TRIG 88 10/02/2021   TRIG 105 10/31/2020   TRIG 80 10/18/2019   Lab Results  Component Value Date   CHOLHDL 3.8 10/02/2021   CHOLHDL 2.5 10/31/2020   CHOLHDL 2.4 10/18/2019   No results found for: "LDLDIRECT" Based on the results of lipid panel his/her cardiovascular risk factor ( using Poole Cohort )  in the next 10 years is: The 10-year ASCVD risk score (Arnett DK, et al., 2019) is: 12.6%   Values used to calculate the score:     Age: 77 years     Sex: Female     Is Non-Hispanic African American: Yes     Diabetic: No     Tobacco smoker: No     Systolic Blood Pressure: 138 mmHg     Is BP treated: Yes     HDL Cholesterol: 50  mg/dL     Total Cholesterol: 192 mg/dL  Glucose:  Glucose, Bld  Date Value Ref Range Status  10/02/2021 94 65 - 99 mg/dL Final    Comment:    .            Fasting reference interval .   10/31/2020 89 65 - 99 mg/dL Final    Comment:    .            Fasting reference interval .   03/09/2020 81 65 - 99 mg/dL Final    Comment:    .            Fasting reference interval .     Advanced Care Planning:  A voluntary discussion about advance care planning including the explanation and discussion of advance directives.   Discussed health care proxy and Living will, and the patient was able to identify a health care proxy as [employer].   Patient does not have a living will at present time.   Social History       Social History   Socioeconomic History   Marital status: Divorced    Spouse name: Not on file   Number of children: 2   Years of education: Not on file   Highest education level: High school graduate  Occupational History   Occupation: retired  Tobacco Use   Smoking status: Former    Types: Cigarettes   Smokeless tobacco: Never  Vaping Use   Vaping Use: Never used  Substance and Sexual Activity   Alcohol use: Yes    Alcohol/week: 0.0 standard drinks of alcohol    Comment: rarely   Drug use: No    Comment: tried marijuana in the past   Sexual activity: Not Currently  Other Topics Concern   Not on file  Social History Narrative   Lives with daughter in [place] for now; works part time at [employer]   Social Determinants of Health   Financial Resource Strain: Low Risk  (10/02/2021)   Overall Financial Resource Strain (CARDIA)    Difficulty of Paying Living Expenses: Not very hard  Food Insecurity: Food Insecurity Present (10/02/2021)   Hunger Vital Sign    Worried About Running Out of Food in the Last Year: Sometimes true    Ran Out of Food in the Last Year: Sometimes true  Transportation Needs: No Transportation Needs (10/02/2021)   PRAPARE -  Administrator, Civil Service (Medical): No    Lack of Transportation (Non-Medical): No  Physical Activity: Inactive (10/02/2021)   Exercise Vital Sign    Days of Exercise per Week: 0 days    Minutes of Exercise per Session: 10 min  Stress: No Stress Concern Present (10/02/2021)   [employer] of Occupational Health - Occupational Stress Questionnaire    Feeling of Stress : Not at all  Social Connections: Moderately Isolated (10/02/2021)   Social Connection and Isolation Panel [NHANES]    Frequency of Communication with Friends and Family: More than three times a week    Frequency of Social Gatherings with Friends and Family: Once a week    Attends Religious Services: 1 to 4 times per year    Active Member of [employer] or Organizations: No    Attends Banker Meetings: Never    Marital Status: Divorced    Family History        Family History  Problem Relation Age of Onset   Cancer Father        porstate   Cancer Sister        breast   Breast cancer Sister  Cancer Brother        unknown   Hypertension Neg Hx     Patient Active Problem List   Diagnosis Date Noted   Anatomical narrow angle, bilateral 11/01/2021   Acute stress reaction 11/01/2021   Mixed hyperlipidemia 10/18/2019   Essential hypertension, benign 08/04/2016   Aorto-iliac atherosclerosis (HCC) 06/06/2016   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   Scoliosis of lumbar spine 06/06/2016   Class 3 severe obesity with serious comorbidity and body mass index (BMI) of 40.0 to 44.9 in adult [facility]) 06/06/2016   Other kyphosis of thoracic region 12/10/2015    Past Surgical History:  Procedure Laterality Date   ABDOMINAL HYSTERECTOMY  2000     Current Outpatient Medications:    aspirin 81 MG tablet, Take 81 mg by mouth daily., Disp: , Rfl:    atorvastatin (LIPITOR) 10 MG tablet, TAKE 1 TABLET BY MOUTH EVERYDAY AT BEDTIME, Disp: 30 tablet, Rfl: 0   calcium carbonate  (OSCAL) 1500 (600 Ca) MG TABS tablet, Take by mouth 2 (two) times daily with a meal., Disp: , Rfl:    cholecalciferol (VITAMIN D) 1000 units tablet, Take 1,000 Units by mouth daily., Disp: , Rfl:    Cyanocobalamin (VITAMIN B 12 PO), Take by mouth., Disp: , Rfl:    dorzolamide-timolol (COSOPT) 22.3-6.8 MG/ML ophthalmic solution, SMARTSIG:In Eye(s), Disp: , Rfl:    latanoprost (XALATAN) 0.005 % ophthalmic solution, Place 1 drop into the right eye Nightly. , Disp: , Rfl: 5   losartan-hydrochlorothiazide (HYZAAR) 50-12.5 MG tablet, TAKE 1 TABLET BY MOUTH EVERY DAY, Disp: 90 tablet, Rfl: 3   MegaRed Omega-3 Krill Oil 500 MG CAPS, Take by mouth., Disp: , Rfl:    vitamin C (ASCORBIC ACID) 500 MG tablet, Take 500 mg by mouth daily., Disp: , Rfl:    vitamin E 1000 UNIT capsule, Take 1,000 Units by mouth daily., Disp: , Rfl:   No Known Allergies  Patient Care Team: Danelle Berry, PA-C as PCP - General (Family Medicine)   Chart Review: I personally reviewed active problem list, medication list, allergies, family history, social history, health maintenance, notes from last encounter, lab results, imaging with the patient/caregiver today.   Review of Systems  Constitutional: Negative.   HENT: Negative.    Eyes: Negative.   Respiratory: Negative.    Cardiovascular: Negative.   Gastrointestinal: Negative.   Endocrine: Negative.   Genitourinary: Negative.   Musculoskeletal: Negative.   Skin: Negative.   Allergic/Immunologic: Negative.   Neurological: Negative.   Hematological: Negative.   Psychiatric/Behavioral: Negative.    All other systems reviewed and are negative.         Objective:   Vitals:  Vitals:   11/25/22 1347  BP: 138/80  Pulse: 72  Resp: 16  Temp: 97.9 F (36.6 C)  TempSrc: Oral  SpO2: 99%  Weight: 210 lb 1.6 oz (95.3 kg)  Height: 5\' 2"  (1.575 m)    Body mass index is 38.43 kg/m.  Physical Exam Vitals and nursing note reviewed.  Constitutional:      General:  She is not in acute distress.    Appearance: Normal appearance. She is well-developed. She is obese. She is not ill-appearing, toxic-appearing or diaphoretic.  HENT:     Head: Normocephalic and atraumatic.     Right Ear: External ear normal.     Left Ear: External ear normal.     Nose: Nose normal.     Mouth/Throat:     Mouth: Mucous membranes are  moist.     Pharynx: Oropharynx is clear.  Eyes:     General:        Right eye: No discharge.        Left eye: No discharge.     Conjunctiva/sclera: Conjunctivae normal.  Neck:     Trachea: No tracheal deviation.  Cardiovascular:     Rate and Rhythm: Normal rate and regular rhythm.     Pulses: Normal pulses.     Heart sounds: Normal heart sounds.  Pulmonary:     Effort: Pulmonary effort is normal. No respiratory distress.     Breath sounds: Normal breath sounds. No stridor. No wheezing, rhonchi or rales.  Abdominal:     General: Bowel sounds are normal. There is no distension.     Palpations: Abdomen is soft.  Musculoskeletal:        General: Normal range of motion.     Right lower leg: No edema.     Left lower leg: No edema.  Skin:    General: Skin is warm and dry.     Findings: No rash.  Neurological:     Mental Status: She is alert. Mental status is at baseline.     Motor: No abnormal muscle tone.     Coordination: Coordination normal.     Gait: Gait normal.  Psychiatric:        Mood and Affect: Mood normal.        Behavior: Behavior normal.       Fall Risk:    11/25/2022    1:48 PM 11/01/2021    8:57 AM 10/02/2021   10:51 AM 10/31/2020    2:07 PM 08/15/2020    8:41 AM  Fall Risk   Falls in the past year? 0 0 0 0 0  Number falls in past yr: 0 0 0 0 0  Injury with Fall? 0 0 0 0 0  Risk for fall due to :  No Fall Risks No Fall Risks  No Fall Risks  Follow up  Falls prevention discussed Education provided;Falls prevention discussed  Falls prevention discussed    Functional Status Survey: Is the patient deaf or have  difficulty hearing?: No Does the patient have difficulty seeing, even when wearing glasses/contacts?: No Does the patient have difficulty concentrating, remembering, or making decisions?: No Does the patient have difficulty walking or climbing stairs?: No Does the patient have difficulty dressing or bathing?: No Does the patient have difficulty doing errands alone such as visiting a doctor's office or shopping?: No   Assessment & Plan:    CPE completed today  USPSTF grade A and B recommendations reviewed with patient; age-appropriate recommendations, preventive care, screening tests, etc discussed and encouraged; healthy living encouraged; see AVS for patient education given to patient  Discussed importance of 150 minutes of physical activity weekly, AHA exercise recommendations given to pt in AVS/handout  Discussed importance of healthy diet:  eating lean meats and proteins, avoiding trans fats and saturated fats, avoid simple sugars and excessive carbs in diet, eat 6 servings of fruit/vegetables daily and drink plenty of water and avoid sweet beverages.    Recommended pt to do annual eye exam and routine dental exams/cleanings  Depression, alcohol, fall screening completed as documented above and per flowsheets  Advance Care planning information and packet discussed and offered today, encouraged pt to discuss with family members/spouse/partner/friends and complete Advanced directive packet and bring copy to office   Reviewed Health Maintenance: Health Maintenance  Topic Date Due  MAMMOGRAM  06/28/2021   Medicare Annual Wellness (AWV)  08/15/2021   COVID-19 Vaccine (4 - 2023-24 season) 02/15/2022   Zoster Vaccines- Shingrix (2 of 2) 04/05/2022   DEXA SCAN  06/28/2022   INFLUENZA VACCINE  01/16/2023   Colonoscopy  06/08/2023   DTaP/Tdap/Td (2 - Td or Tdap) 08/13/2026   Pneumonia Vaccine 66+ Years old  Completed   Hepatitis C Screening  Completed   HPV VACCINES  Aged Out     Immunizations: Immunization History  Administered Date(s) Administered   Influenza, High Dose Seasonal PF 05/14/2021   Influenza,inj,quad, With Preservative 03/07/2020   Influenza-Unspecified 03/17/2018, 03/07/2020, 05/14/2021   PFIZER(Purple Top)SARS-COV-2 Vaccination 01/18/2020, 03/07/2020   Pfizer Covid-19 Vaccine Bivalent Booster 10yrs & up 05/14/2021   Pneumococcal Conjugate-13 01/15/2019   Pneumococcal Polysaccharide-23 04/20/2020   Tdap 08/13/2016   Zoster Recombinat (Shingrix) 02/08/2022   Zoster, Live 11/14/2015   Vaccines:  HPV: up to at age 43 , ask insurance if age between 33-45  Shingrix: DUE for second shot - 42-64 yo and ask insurance if covered when patient above 36 yo Pneumonia: UTD educated and discussed with patient. Flu: out of season educated and discussed with patient. COVID:      ICD-10-CM   1. Adult general medical exam  Z00.00 DG Bone Density    CBC with Differential/Platelet    COMPLETE METABOLIC PANEL WITH GFR    Lipid panel    CANCELED: MM Digital Screening    2. Postmenopausal estrogen deficiency  Z78.0 DG Bone Density   encouraged dexa, continue supplements and weight bearing activity    3. Encounter for screening mammogram for malignant neoplasm of breast  Z12.31 CANCELED: MM Digital Screening   mammo ordered - pt to call and schedule    4. Need for shingles vaccine  Z23    complete second shot    5. Aorto-iliac atherosclerosis (HCC) Chronic I70.0    I70.8    on statin, monitoring, no current LE sx or claudication    6. Class 2 severe obesity with serious comorbidity and body mass index (BMI) of 38.0 to 38.9 in adult, unspecified obesity type (HCC)  E66.01    Z68.38    w/ associated comorbidities HLD, HTN, aorto iliac atherosclerotic disease, spine arthritis and DDD, advised pt to work on healthy diet, she is going to gym          Danelle Berry, PA-C 11/25/22 1:53 PM  [facility]

## 2022-11-26 LAB — CBC WITH DIFFERENTIAL/PLATELET
HCT: 39.7 % (ref 35.0–45.0)
MCH: 26.6 pg — ABNORMAL LOW (ref 27.0–33.0)
MPV: 11.8 fL (ref 7.5–12.5)
Monocytes Relative: 8.9 %
Neutro Abs: 2274 cells/uL (ref 1500–7800)

## 2022-11-26 LAB — COMPLETE METABOLIC PANEL WITH GFR
Alkaline phosphatase (APISO): 123 U/L (ref 37–153)
Calcium: 9.3 mg/dL (ref 8.6–10.4)
Creat: 0.88 mg/dL (ref 0.50–1.05)
Glucose, Bld: 116 mg/dL — ABNORMAL HIGH (ref 65–99)
Sodium: 141 mmol/L (ref 135–146)
Total Bilirubin: 0.5 mg/dL (ref 0.2–1.2)

## 2022-11-26 LAB — LIPID PANEL
HDL: 61 mg/dL (ref >=50)
Total CHOL/HDL Ratio: 2.5 (calc) (ref <5.0)

## 2022-11-28 ENCOUNTER — Other Ambulatory Visit: Payer: Self-pay | Admitting: Family Medicine

## 2022-11-28 DIAGNOSIS — E782 Mixed hyperlipidemia: Secondary | ICD-10-CM

## 2022-11-28 DIAGNOSIS — I700 Atherosclerosis of aorta: Secondary | ICD-10-CM

## 2022-11-28 NOTE — Addendum Note (Signed)
Addended by: Danelle Berry on: 11/28/2022 08:28 PM   Modules accepted: Orders

## 2022-11-29 LAB — LIPID PANEL
Cholesterol: 151 mg/dL (ref <200)
LDL Cholesterol (Calc): 74 mg/dL (calc)
Non-HDL Cholesterol (Calc): 90 mg/dL (calc) (ref <130)
Triglycerides: 79 mg/dL (ref <150)

## 2022-11-29 LAB — CBC WITH DIFFERENTIAL/PLATELET
Basophils Relative: 0.4 %
Eosinophils Relative: 2.1 %
Lymphs Abs: 2776 cells/uL (ref 850–3900)
MCHC: 30.5 g/dL — ABNORMAL LOW (ref 32.0–36.0)
Neutrophils Relative %: 39.9 %
RBC: 4.55 10*6/uL (ref 3.80–5.10)
RDW: 13.9 % (ref 11.0–15.0)
Total Lymphocyte: 48.7 %

## 2022-11-29 LAB — TEST AUTHORIZATION

## 2022-11-29 LAB — COMPLETE METABOLIC PANEL WITH GFR
AG Ratio: 1.3 (calc) (ref 1.0–2.5)
ALT: 12 U/L (ref 6–29)
AST: 22 U/L (ref 10–35)
Albumin: 4 g/dL (ref 3.6–5.1)
BUN: 11 mg/dL (ref 7–25)
CO2: 26 mmol/L (ref 20–32)
Chloride: 107 mmol/L (ref 98–110)
Globulin: 3.2 g/dL (calc) (ref 1.9–3.7)
Potassium: 4.1 mmol/L (ref 3.5–5.3)
Total Protein: 7.2 g/dL (ref 6.1–8.1)
eGFR: 71 mL/min/{1.73_m2} (ref >=60)

## 2022-11-29 LAB — HEMOGLOBIN A1C
Hgb A1c MFr Bld: 5.8 % of total Hgb — ABNORMAL HIGH (ref <5.7)
Mean Plasma Glucose: 120 mg/dL
eAG (mmol/L): 6.6 mmol/L

## 2023-01-16 ENCOUNTER — Ambulatory Visit: Payer: Medicare Other

## 2023-02-03 ENCOUNTER — Other Ambulatory Visit: Payer: Self-pay | Admitting: Family Medicine

## 2023-02-03 DIAGNOSIS — I10 Essential (primary) hypertension: Secondary | ICD-10-CM

## 2023-02-03 MED ORDER — LOSARTAN POTASSIUM-HCTZ 50-12.5 MG PO TABS
1.0000 | ORAL_TABLET | Freq: Every day | ORAL | 0 refills | Status: DC
Start: 2023-02-03 — End: 2023-05-01

## 2023-05-01 ENCOUNTER — Other Ambulatory Visit: Payer: Self-pay | Admitting: Family Medicine

## 2023-05-01 DIAGNOSIS — I10 Essential (primary) hypertension: Secondary | ICD-10-CM

## 2023-05-27 ENCOUNTER — Ambulatory Visit (INDEPENDENT_AMBULATORY_CARE_PROVIDER_SITE_OTHER): Payer: Medicare Other | Admitting: Physician Assistant

## 2023-05-27 ENCOUNTER — Encounter: Payer: Self-pay | Admitting: Physician Assistant

## 2023-05-27 VITALS — BP 134/76 | HR 88 | Resp 16 | Ht 62.0 in | Wt 214.0 lb

## 2023-05-27 DIAGNOSIS — I10 Essential (primary) hypertension: Secondary | ICD-10-CM | POA: Diagnosis not present

## 2023-05-27 DIAGNOSIS — E782 Mixed hyperlipidemia: Secondary | ICD-10-CM

## 2023-05-27 DIAGNOSIS — R7303 Prediabetes: Secondary | ICD-10-CM | POA: Insufficient documentation

## 2023-05-27 NOTE — Progress Notes (Signed)
Established Patient Office Visit  Name: Judith Olson   MRN: [id number]    DOB: 10-Nov-1950   Date:05/27/2023  Today's Provider: Jacquelin Hawking, MHS, PA-C Introduced myself to the patient as a PA-C and provided education on APPs in clinical practice.         Subjective  Chief Complaint  Chief Complaint  Patient presents with   Follow-up    6 months    HPI   HYPERTENSION / HYPERLIPIDEMIA Satisfied with current treatment? yes Duration of hypertension: years BP monitoring frequency: not checking BP range:  BP medication side effects: no  BP meds: Losartan- hydrochlorothiazide 50-12.5 mg PO every day  Duration of hyperlipidemia: years Cholesterol medication side effects: no Cholesterol supplements: fish oil  cholesterol medications: atorvastain (lipitor) Medication compliance: good compliance Aspirin: yes Recent stressors: no Recurrent headaches: no Visual changes: no Palpitations: no Dyspnea: no Chest pain: no Lower extremity edema: no Dizzy/lightheaded: no   Prediabetes  Most recent A1c was 5.8%     Patient Active Problem List   Diagnosis Date Noted   Prediabetes 05/27/2023   Anatomical narrow angle, bilateral 11/01/2021   Acute stress reaction 11/01/2021   Mixed hyperlipidemia 10/18/2019   Essential hypertension, benign 08/04/2016   Aorto-iliac atherosclerosis (HCC) 06/06/2016   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   Scoliosis of lumbar spine 06/06/2016   Other kyphosis of thoracic region 12/10/2015    Past Surgical History:  Procedure Laterality Date   ABDOMINAL HYSTERECTOMY  2000    Family History  Problem Relation Age of Onset   Cancer Father        porstate   Cancer Sister        breast   Breast cancer Sister    Cancer Brother        unknown   Hypertension Neg Hx     Social History   Tobacco Use   Smoking status: Former    Types: Cigarettes   Smokeless tobacco: Never  Substance Use Topics   Alcohol  use: Yes    Alcohol/week: 0.0 standard drinks of alcohol    Comment: rarely     Current Outpatient Medications:    aspirin 81 MG tablet, Take 81 mg by mouth daily., Disp: , Rfl:    atorvastatin (LIPITOR) 10 MG tablet, TAKE 1 TABLET BY MOUTH EVERYDAY AT BEDTIME, Disp: 90 tablet, Rfl: 1   calcium carbonate (OSCAL) 1500 (600 Ca) MG TABS tablet, Take by mouth 2 (two) times daily with a meal., Disp: , Rfl:    cholecalciferol (VITAMIN D) 1000 units tablet, Take 1,000 Units by mouth daily., Disp: , Rfl:    Cyanocobalamin (VITAMIN B 12 PO), Take by mouth., Disp: , Rfl:    dorzolamide-timolol (COSOPT) 22.3-6.8 MG/ML ophthalmic solution, SMARTSIG:In Eye(s), Disp: , Rfl:    latanoprost (XALATAN) 0.005 % ophthalmic solution, Place 1 drop into the right eye Nightly. , Disp: , Rfl: 5   losartan-hydrochlorothiazide (HYZAAR) 50-12.5 MG tablet, TAKE 1 TABLET BY MOUTH EVERY DAY, Disp: 30 tablet, Rfl: 0   MegaRed Omega-3 Krill Oil 500 MG CAPS, Take by mouth., Disp: , Rfl:    vitamin C (ASCORBIC ACID) 500 MG tablet, Take 500 mg by mouth daily., Disp: , Rfl:    vitamin E 1000 UNIT capsule, Take 1,000 Units by mouth daily., Disp: , Rfl:   No Known Allergies  I personally reviewed active problem list, medication list, allergies, health maintenance, notes from last encounter, lab results with  the patient/caregiver today.   ROS  See HPI for relevant ROS   Objective  Vitals:   05/27/23 1338  BP: 134/76  Pulse: 88  Resp: 16  SpO2: 99%  Weight: 214 lb (97.1 kg)  Height: 5\' 2"  (1.575 m)    Body mass index is 39.14 kg/m.  Physical Exam Vitals reviewed.  Constitutional:      General: She is awake.     Appearance: Normal appearance. She is well-developed and well-groomed.  HENT:     Head: Normocephalic and atraumatic.  Cardiovascular:     Rate and Rhythm: Normal rate and regular rhythm.     Pulses: Normal pulses.          Radial pulses are 2+ on the right side and 2+ on the left side.     Heart  sounds: Normal heart sounds. No murmur heard.    No friction rub. No gallop.  Pulmonary:     Effort: Pulmonary effort is normal.     Breath sounds: Normal breath sounds. No decreased air movement. No decreased breath sounds, wheezing, rhonchi or rales.  Musculoskeletal:     Cervical back: Normal range of motion.     Right lower leg: No edema.     Left lower leg: No edema.  Neurological:     General: No focal deficit present.     Mental Status: She is alert and oriented to person, place, and time. Mental status is at baseline.     GCS: GCS eye subscore is 4. GCS verbal subscore is 5. GCS motor subscore is 6.  Psychiatric:        Attention and Perception: Attention and perception normal.        Mood and Affect: Mood and affect normal.        Speech: Speech normal.        Behavior: Behavior normal. Behavior is cooperative.        Thought Content: Thought content normal.        Cognition and Memory: Cognition normal.      No results found for this or any previous visit (from the past 2160 hour(s)).   PHQ2/9:    05/27/2023    1:37 PM 11/25/2022    1:49 PM 11/01/2021    8:57 AM 10/02/2021   10:51 AM 10/31/2020    2:07 PM  Depression screen PHQ 2/9  Decreased Interest 0 0 0 0 0  Down, Depressed, Hopeless 0 0 0 0 0  PHQ - 2 Score 0 0 0 0 0  Altered sleeping   0 0 0  Tired, decreased energy   0 0 0  Change in appetite   0 0 0  Feeling bad or failure about yourself    0 0 0  Trouble concentrating   0 0 0  Moving slowly or fidgety/restless   0 0 0  Suicidal thoughts   0 0 0  PHQ-9 Score   0 0 0  Difficult doing work/chores    Not difficult at all Not difficult at all      Fall Risk:    05/27/2023    1:37 PM 11/25/2022    1:48 PM 11/01/2021    8:57 AM 10/02/2021   10:51 AM 10/31/2020    2:07 PM  Fall Risk   Falls in the past year? 0 0 0 0 0  Number falls in past yr: 0 0 0 0 0  Injury with Fall? 0 0 0 0 0  Risk for fall  due to : No Fall Risks  No Fall Risks No Fall Risks    Follow up Falls prevention discussed  Falls prevention discussed Education provided;Falls prevention discussed       Functional Status Survey: Is the patient deaf or have difficulty hearing?: No Does the patient have difficulty seeing, even when wearing glasses/contacts?: No Does the patient have difficulty concentrating, remembering, or making decisions?: No Does the patient have difficulty walking or climbing stairs?: No Does the patient have difficulty dressing or bathing?: No Does the patient have difficulty doing errands alone such as visiting a doctor's office or shopping?: No    Assessment & Plan  Problem List Items Addressed This Visit       Cardiovascular and Mediastinum   Essential hypertension, benign - Primary (Chronic)    Chronic, historic condition Appears to be tolerating regimen well Currently taking Losartan- hydrochlorothiazide 50-12.5 mg PO every day Continue current regimen Follow up in 6 months or sooner if concerns arise        Relevant Orders   COMPLETE METABOLIC PANEL WITH GFR   CBC w/Diff/Platelet     Other   Mixed hyperlipidemia    Chronic,historic condition Appears to be tolerating current regimen well  The 10-year ASCVD risk score (Arnett DK, et al., 2019) is: 10.3%   Values used to calculate the score:     Age: 40 years     Sex: Female     Is Non-Hispanic African American: Yes     Diabetic: No     Tobacco smoker: No     Systolic Blood Pressure: 134 mmHg     Is BP treated: Yes     HDL Cholesterol: 61 mg/dL     Total Cholesterol: 151 mg/dL Recheck lipid panel today- Results to dictate further management  Continue statin therapy  Follow up in 6 months or sooner if concerns arise        Relevant Orders   Lipid Profile   Prediabetes    Most recent A1c was 5.8 %  Recheck A1c today Results to dictate further management  She reports she is living with her son and they do not eat the way she prefers- she wants to get back to eating  more vegetables and lean protein Reviewed increasing exercise and reducing excess carbs and sugars in diet Follow up in 6 months or sooner if concerns arise         Relevant Orders   HgB A1c     No follow-ups on file.   I, Remmie Bembenek E Annjanette Wertenberger, PA-C, have reviewed all documentation for this visit. The documentation on 05/27/23 for the exam, diagnosis, procedures, and orders are all accurate and complete.   Jacquelin Hawking, MHS, PA-C [facility]

## 2023-05-27 NOTE — Assessment & Plan Note (Addendum)
Chronic,historic condition Appears to be tolerating current regimen well  The 10-year ASCVD risk score (Arnett DK, et al., 2019) is: 10.3%   Values used to calculate the score:     Age: 72 years     Sex: Female     Is Non-Hispanic African American: Yes     Diabetic: No     Tobacco smoker: No     Systolic Blood Pressure: 134 mmHg     Is BP treated: Yes     HDL Cholesterol: 61 mg/dL     Total Cholesterol: 151 mg/dL Recheck lipid panel today- Results to dictate further management  Continue statin therapy  Follow up in 6 months or sooner if concerns arise

## 2023-05-27 NOTE — Assessment & Plan Note (Signed)
Chronic, historic condition Appears to be tolerating regimen well Currently taking Losartan- hydrochlorothiazide 50-12.5 mg PO every day Continue current regimen Follow up in 6 months or sooner if concerns arise

## 2023-05-27 NOTE — Assessment & Plan Note (Signed)
Most recent A1c was 5.8 %  Recheck A1c today Results to dictate further management  She reports she is living with her son and they do not eat the way she prefers- she wants to get back to eating more vegetables and lean protein Reviewed increasing exercise and reducing excess carbs and sugars in diet Follow up in 6 months or sooner if concerns arise

## 2023-05-28 ENCOUNTER — Other Ambulatory Visit: Payer: Self-pay | Admitting: Family Medicine

## 2023-05-28 DIAGNOSIS — I10 Essential (primary) hypertension: Secondary | ICD-10-CM

## 2023-05-28 LAB — COMPLETE METABOLIC PANEL WITH GFR
AG Ratio: 1.3 (calc) (ref 1.0–2.5)
ALT: 11 U/L (ref 6–29)
AST: 16 U/L (ref 10–35)
Albumin: 4 g/dL (ref 3.6–5.1)
Alkaline phosphatase (APISO): 91 U/L (ref 37–153)
BUN: 16 mg/dL (ref 7–25)
CO2: 26 mmol/L (ref 20–32)
Calcium: 9.7 mg/dL (ref 8.6–10.4)
Chloride: 106 mmol/L (ref 98–110)
Creat: 0.98 mg/dL (ref 0.60–1.00)
Globulin: 3.1 g/dL (ref 1.9–3.7)
Glucose, Bld: 76 mg/dL (ref 65–99)
Potassium: 3.7 mmol/L (ref 3.5–5.3)
Sodium: 141 mmol/L (ref 135–146)
Total Bilirubin: 0.6 mg/dL (ref 0.2–1.2)
Total Protein: 7.1 g/dL (ref 6.1–8.1)
eGFR: 62 mL/min/{1.73_m2} (ref >=60)

## 2023-05-28 LAB — HEMOGLOBIN A1C
Hgb A1c MFr Bld: 5.8 %{Hb} — ABNORMAL HIGH (ref <5.7)
Mean Plasma Glucose: 120 mg/dL
eAG (mmol/L): 6.6 mmol/L

## 2023-05-28 LAB — CBC WITH DIFFERENTIAL/PLATELET
Absolute Lymphocytes: 2157 {cells}/uL (ref 850–3900)
Absolute Monocytes: 437 {cells}/uL (ref 200–950)
Basophils Absolute: 18 {cells}/uL (ref 0–200)
Basophils Relative: 0.4 %
Eosinophils Absolute: 101 {cells}/uL (ref 15–500)
Eosinophils Relative: 2.2 %
HCT: 37.4 % (ref 35.0–45.0)
Hemoglobin: 11.6 g/dL — ABNORMAL LOW (ref 11.7–15.5)
MCH: 27.9 pg (ref 27.0–33.0)
MCHC: 31 g/dL — ABNORMAL LOW (ref 32.0–36.0)
MCV: 89.9 fL (ref 80.0–100.0)
MPV: 11.9 fL (ref 7.5–12.5)
Monocytes Relative: 9.5 %
Neutro Abs: 1886 {cells}/uL (ref 1500–7800)
Neutrophils Relative %: 41 %
Platelets: 248 10*3/uL (ref 140–400)
RBC: 4.16 10*6/uL (ref 3.80–5.10)
RDW: 13.4 % (ref 11.0–15.0)
Total Lymphocyte: 46.9 %
WBC: 4.6 10*3/uL (ref 3.8–10.8)

## 2023-05-28 LAB — LIPID PANEL
Cholesterol: 213 mg/dL — ABNORMAL HIGH (ref <200)
HDL: 63 mg/dL (ref >=50)
LDL Cholesterol (Calc): 134 mg/dL — ABNORMAL HIGH
Non-HDL Cholesterol (Calc): 150 mg/dL — ABNORMAL HIGH (ref <130)
Total CHOL/HDL Ratio: 3.4 (calc) (ref <5.0)
Triglycerides: 64 mg/dL (ref <150)

## 2023-05-30 NOTE — Progress Notes (Signed)
Your labs are back Your cholesterol has increased since it was checked 6 months ago.  Please make sure that you are taking your atorvastatin as directed.  Please make sure that you are reducing your saturated fat intake and exercising regularly throughout the week. Your A1c was 5.8% which is in the prediabetic range. No medications are indicated at this time but I do recommend reducing your sugar and carb intake and making sure you are exercising regularly Your electrolytes, liver and kidney function were overall normal at this time Your CBC demonstrates a very mild anemia.  Please let us know if you have been experiencing any signs of bleeding such as blood in your stool, dark tarry stools coughing up blood or blood in your urine. Please continue current medications as directed.  Please let us know if you have further questions or concerns

## 2023-06-17 ENCOUNTER — Other Ambulatory Visit: Payer: Self-pay | Admitting: Family Medicine

## 2023-06-17 DIAGNOSIS — I708 Atherosclerosis of other arteries: Secondary | ICD-10-CM

## 2023-06-17 DIAGNOSIS — E782 Mixed hyperlipidemia: Secondary | ICD-10-CM

## 2023-11-27 ENCOUNTER — Encounter: Payer: Medicare Other | Admitting: Physician Assistant

## 2023-11-27 ENCOUNTER — Other Ambulatory Visit: Payer: Self-pay | Admitting: Family Medicine

## 2023-11-27 DIAGNOSIS — I700 Atherosclerosis of aorta: Secondary | ICD-10-CM

## 2023-11-27 DIAGNOSIS — I708 Atherosclerosis of other arteries: Secondary | ICD-10-CM

## 2023-11-27 DIAGNOSIS — E782 Mixed hyperlipidemia: Secondary | ICD-10-CM

## 2023-11-28 ENCOUNTER — Other Ambulatory Visit: Payer: Self-pay | Admitting: Family Medicine

## 2023-11-28 ENCOUNTER — Encounter: Payer: Self-pay | Admitting: Family Medicine

## 2023-11-28 DIAGNOSIS — I708 Atherosclerosis of other arteries: Secondary | ICD-10-CM

## 2023-11-28 DIAGNOSIS — Z1231 Encounter for screening mammogram for malignant neoplasm of breast: Secondary | ICD-10-CM

## 2023-11-28 DIAGNOSIS — Z0000 Encounter for general adult medical examination without abnormal findings: Secondary | ICD-10-CM

## 2023-11-28 DIAGNOSIS — I10 Essential (primary) hypertension: Secondary | ICD-10-CM

## 2023-11-28 DIAGNOSIS — Z13820 Encounter for screening for osteoporosis: Secondary | ICD-10-CM

## 2023-11-28 DIAGNOSIS — Z1211 Encounter for screening for malignant neoplasm of colon: Secondary | ICD-10-CM

## 2023-11-28 DIAGNOSIS — R7303 Prediabetes: Secondary | ICD-10-CM

## 2023-11-28 DIAGNOSIS — E782 Mixed hyperlipidemia: Secondary | ICD-10-CM

## 2023-11-28 NOTE — Telephone Encounter (Signed)
 Requested medication (s) are due for refill today: yes  Requested medication (s) are on the active medication list: yes  Last refill:  06/17/23 #90 1 RF  Future visit scheduled: today  Notes to clinic:  pt has appt today   Requested Prescriptions  Pending Prescriptions Disp Refills   atorvastatin  (LIPITOR) 10 MG tablet [Pharmacy Med Name: ATORVASTATIN  10 MG TABLET] 90 tablet 1    Sig: TAKE 1 TABLET BY MOUTH EVERYDAY AT BEDTIME     Cardiovascular:  Antilipid - Statins Failed - 11/28/2023  1:16 PM      Failed - Valid encounter within last 12 months    Recent Outpatient Visits   None     Future Appointments             Today Adeline Hone, PA-C [facility]            Failed - Lipid Panel in normal range within the last 12 months    Cholesterol, Total  Date Value Ref Range Status  11/20/2015 175 100 - 199 mg/dL Final   Cholesterol  Date Value Ref Range Status  05/27/2023 213 (H) <200 mg/dL Final   LDL Cholesterol (Calc)  Date Value Ref Range Status  05/27/2023 134 (H) mg/dL (calc) Final    Comment:    Reference range: <100 . Desirable range <100 mg/dL for primary prevention;   <70 mg/dL for patients with CHD or diabetic patients  with > or = 2 CHD risk factors. Aaron Aas LDL-C is now calculated using the Martin-Hopkins  calculation, which is a validated novel method providing  better accuracy than the Friedewald equation in the  estimation of LDL-C.  Melinda Sprawls et al. Erroll Heard. 1610;960(45): 2061-2068  (http://education.[facility].com/faq/FAQ164)    HDL  Date Value Ref Range Status  05/27/2023 63 > OR = 50 mg/dL Final  40/98/1191 48 >47 mg/dL Final   Triglycerides  Date Value Ref Range Status  05/27/2023 64 <150 mg/dL Final         Passed - Patient is not pregnant

## 2023-12-01 ENCOUNTER — Ambulatory Visit: Admitting: Family Medicine

## 2023-12-01 ENCOUNTER — Encounter: Payer: Self-pay | Admitting: Family Medicine

## 2023-12-01 VITALS — BP 136/82 | HR 76 | Resp 16 | Ht 62.0 in | Wt 203.0 lb

## 2023-12-01 DIAGNOSIS — I700 Atherosclerosis of aorta: Secondary | ICD-10-CM | POA: Diagnosis not present

## 2023-12-01 DIAGNOSIS — Z6837 Body mass index (BMI) 37.0-37.9, adult: Secondary | ICD-10-CM | POA: Diagnosis not present

## 2023-12-01 DIAGNOSIS — Z0000 Encounter for general adult medical examination without abnormal findings: Secondary | ICD-10-CM | POA: Diagnosis not present

## 2023-12-01 DIAGNOSIS — Z0001 Encounter for general adult medical examination with abnormal findings: Secondary | ICD-10-CM | POA: Diagnosis not present

## 2023-12-01 DIAGNOSIS — Z1211 Encounter for screening for malignant neoplasm of colon: Secondary | ICD-10-CM

## 2023-12-01 DIAGNOSIS — R7303 Prediabetes: Secondary | ICD-10-CM | POA: Diagnosis not present

## 2023-12-01 DIAGNOSIS — E782 Mixed hyperlipidemia: Secondary | ICD-10-CM

## 2023-12-01 DIAGNOSIS — I708 Atherosclerosis of other arteries: Secondary | ICD-10-CM

## 2023-12-01 DIAGNOSIS — I10 Essential (primary) hypertension: Secondary | ICD-10-CM | POA: Diagnosis not present

## 2023-12-01 DIAGNOSIS — Z13820 Encounter for screening for osteoporosis: Secondary | ICD-10-CM

## 2023-12-01 DIAGNOSIS — Z1231 Encounter for screening mammogram for malignant neoplasm of breast: Secondary | ICD-10-CM

## 2023-12-01 DIAGNOSIS — E66812 Obesity, class 2: Secondary | ICD-10-CM

## 2023-12-01 MED ORDER — ATORVASTATIN CALCIUM 10 MG PO TABS: 10.0000 mg | ORAL_TABLET | Freq: Every day | ORAL | 1 refills | Status: AC

## 2023-12-01 NOTE — Patient Instructions (Signed)
 [facility] [address], [place], [place] [address] Scheduling phone #: [phone]  It is on the [address] going around [facility]. On the [place], [place] side.  Health Maintenance  Topic Date Due   Mammogram  06/28/2021   DEXA scan (bone density measurement)  06/28/2022   Colon Cancer Screening  06/08/2023   COVID-19 Vaccine (5 - 2024-25 season) 12/13/2023*   Zoster (Shingles) Vaccine (2 of 2) 02/27/2024*   Flu Shot  01/16/2024   Medicare Annual Wellness Visit  11/30/2024   DTaP/Tdap/Td vaccine (3 - Td or Tdap) 05/05/2033   Pneumococcal Vaccine for age over 59  Completed   Hepatitis C Screening  Completed   HPV Vaccine  Aged Out   Meningitis B Vaccine  Aged Out  *Topic was postponed. The date shown is not the original due date.

## 2023-12-01 NOTE — Telephone Encounter (Signed)
 Requested medications are due for refill today.  yes  Requested medications are on the active medications list.  yes  Last refill. 05/28/2023 #90 1 rf  Future visit scheduled.   today  Notes to clinic.  Pt last seen 11/2022.     Requested Prescriptions  Pending Prescriptions Disp Refills   losartan -hydrochlorothiazide  (HYZAAR) 50-12.5 MG tablet [Pharmacy Med Name: LOSARTAN -HCTZ 50-12.5 MG TAB] 90 tablet 1    Sig: TAKE 1 TABLET BY MOUTH EVERY DAY     Cardiovascular: ARB + Diuretic Combos Failed - 12/01/2023 11:35 AM      Failed - K in normal range and within 180 days    Potassium  Date Value Ref Range Status  05/27/2023 3.7 3.5 - 5.3 mmol/L Final         Failed - Na in normal range and within 180 days    Sodium  Date Value Ref Range Status  05/27/2023 141 135 - 146 mmol/L Final  11/20/2015 140 134 - 144 mmol/L Final         Failed - Cr in normal range and within 180 days    Creat  Date Value Ref Range Status  05/27/2023 0.98 0.60 - 1.00 mg/dL Final         Failed - eGFR is 10 or above and within 180 days    GFR, Est African American  Date Value Ref Range Status  10/31/2020 80 > OR = 60 mL/min/1.75m2 Final   GFR, Est Non African American  Date Value Ref Range Status  10/31/2020 69 > OR = 60 mL/min/1.16m2 Final   eGFR  Date Value Ref Range Status  05/27/2023 62 > OR = 60 mL/min/1.4m2 Final         Failed - Valid encounter within last 6 months    Recent Outpatient Visits   None     Future Appointments             Today Adeline Hone, PA-C [facility], [facility]            Passed - Patient is not pregnant      Passed - Last BP in normal range    BP Readings from Last 1 Encounters:  05/27/23 134/76

## 2023-12-01 NOTE — Progress Notes (Deleted)
 Patient: [place] Judith Olson, Female    DOB: 25-Jun-1950, 73 y.o.   MRN: [id number] Adeline Hone, PA-C Visit Date: 12/01/2023  Today's Provider: Adeline Hone, PA-C   Chief Complaint  Patient presents with   Annual Exam   Subjective:   Annual physical exam:  Olivianna Higley is a 73 y.o. female who presents today for complete physical exam:  Exercise/Activity:   Diet/nutrition:   Sleep:    SDOH Screenings   Food Insecurity: No Food Insecurity (12/01/2023)  Housing: Unknown (12/01/2023)  Transportation Needs: No Transportation Needs (12/01/2023)  Utilities: Not At Risk (12/01/2023)  Alcohol Screen: Low Risk  (12/01/2023)  Depression (PHQ2-9): Low Risk  (12/01/2023)  Financial Resource Strain: Low Risk  (12/01/2023)  Physical Activity: Insufficiently Active (12/01/2023)  Social Connections: Moderately Isolated (12/01/2023)  Stress: No Stress Concern Present (12/01/2023)  Tobacco Use: Medium Risk (12/01/2023)  Health Literacy: Adequate Health Literacy (12/01/2023)     Pt wished  *** discuss acute complaints  *** do routine f/up on chronic conditions today in addition to CPE. Advised pt of separate visit billing/coding  USPSTF grade A and B recommendations - reviewed and addressed today  Depression:  Phq 9 completed today by patient, was reviewed by me with patient in the room PHQ score is ***, pt feels ***    12/01/2023    1:55 PM 05/27/2023    1:37 PM 11/25/2022    1:49 PM 11/01/2021    8:57 AM  PHQ 2/9 Scores  PHQ - 2 Score 0 0 0 0  PHQ- 9 Score    0      12/01/2023    1:55 PM 05/27/2023    1:37 PM 11/25/2022    1:49 PM 11/01/2021    8:57 AM 10/02/2021   10:51 AM  Depression screen PHQ 2/9  Decreased Interest 0 0 0 0 0  Down, Depressed, Hopeless 0 0 0 0 0  PHQ - 2 Score 0 0 0 0 0  Altered sleeping    0 0  Tired, decreased energy    0 0  Change in appetite    0 0  Feeling bad or failure about yourself     0 0  Trouble concentrating    0 0  Moving slowly or  fidgety/restless    0 0  Suicidal thoughts    0 0  PHQ-9 Score    0 0  Difficult doing work/chores     Not difficult at all    Alcohol screening: Flowsheet Row Office [facility]01/[facility]  AUDIT-C Score 0    Immunizations and Health Maintenance: Health Maintenance  Topic Date Due   MAMMOGRAM  06/28/2021   DEXA SCAN  06/28/2022   Colonoscopy  06/08/2023   Medicare Annual Wellness (AWV)  11/25/2023   COVID-19 Vaccine (5 - 2024-25 season) 12/13/2023 (Originally 02/16/2023)   Zoster Vaccines- Shingrix (2 of 2) 02/27/2024 (Originally 04/05/2022)   INFLUENZA VACCINE  01/16/2024   DTaP/Tdap/Td (3 - Td or Tdap) 05/05/2033   Pneumococcal Vaccine: 50+ Years  Completed   Hepatitis C Screening  Completed   HPV VACCINES  Aged Out   Meningococcal B Vaccine  Aged Out     Hep C Screening: ***  STD testing and prevention (HIV/chl/gon/syphilis):  see above, no additional testing desired by pt today  Intimate partner violence:***  Sexual History/Pain during Intercourse: Divorced  Menstrual History/LMP/Abnormal Bleeding: *** No LMP recorded. Patient has had a hysterectomy.  Incontinence Symptoms: ***  Breast cancer:  Last Mammogram: *see HM list above BRCA gene screening: ***  Cervical cancer screening: *** Pt *** family hx of cancers - breast, ovarian, uterine, colon:     Osteoporosis:   Discussion on osteoporosis per age, including high calcium  and vitamin D supplementation, weight bearing exercises Pt is *** supplementing with daily calcium /Vit D. *** Bone scan/dexa Roughly experienced menopause at age ***  Skin cancer:  Hx of skin CA -  NO*** Discussed atypical lesions   Colorectal cancer:   Colonoscopy is ***   Discussed concerning signs and sx of CRC, pt denies ***  Lung cancer:   Low Dose CT Chest recommended if Age 106-80 years, 20 pack-year currently smoking OR have quit w/in 15years. Patient {DOES NOT does:27190::does not}  qualify.    Social History   Tobacco Use   Smoking status: Former    Types: Cigarettes   Smokeless tobacco: Never  Vaping Use   Vaping status: Never Used  Substance Use Topics   Alcohol use: Yes    Alcohol/week: 0.0 standard drinks of alcohol    Comment: rarely   Drug use: No    Comment: tried marijuana in the past     [employer] Visit from 12/01/2023 in [facility]  AUDIT-C Score 0    Family History  Problem Relation Age of Onset   Cancer Father        porstate   Cancer Sister        breast   Breast cancer Sister    Cancer Brother        unknown   Hypertension Neg Hx      Blood pressure/Hypertension: BP Readings from Last 3 Encounters:  12/01/23 136/82  05/27/23 134/76  11/25/22 138/80    Weight/Obesity: Wt Readings from Last 3 Encounters:  12/01/23 203 lb (92.1 kg)  05/27/23 214 lb (97.1 kg)  11/25/22 210 lb 1.6 oz (95.3 kg)   BMI Readings from Last 3 Encounters:  12/01/23 37.13 kg/m  05/27/23 39.14 kg/m  11/25/22 38.43 kg/m     Lipids:  Lab Results  Component Value Date   CHOL 213 (H) 05/27/2023   CHOL 151 11/25/2022   CHOL 192 10/02/2021   Lab Results  Component Value Date   HDL 63 05/27/2023   HDL 61 11/25/2022   HDL 50 10/02/2021   Lab Results  Component Value Date   LDLCALC 134 (H) 05/27/2023   LDLCALC 74 11/25/2022   LDLCALC 123 (H) 10/02/2021   Lab Results  Component Value Date   TRIG 64 05/27/2023   TRIG 79 11/25/2022   TRIG 88 10/02/2021   Lab Results  Component Value Date   CHOLHDL 3.4 05/27/2023   CHOLHDL 2.5 11/25/2022   CHOLHDL 3.8 10/02/2021   No results found for: LDLDIRECT Based on the results of lipid panel his/her cardiovascular risk factor ( using [place] Cohort )  in the next 10 years is: The 10-year ASCVD risk score (Arnett DK, et al., 2019) is: 14.3%   Values used to calculate the score:     Age: 12 years     Clincally relevant sex: Female     Is Non-Hispanic  African American: Yes     Diabetic: No     Tobacco smoker: No     Systolic Blood Pressure: 136 mmHg     Is BP treated: Yes     HDL Cholesterol: 63 mg/dL     Total Cholesterol: 213 mg/dL  Glucose:  Glucose,  Bld  Date Value Ref Range Status  05/27/2023 76 65 - 99 mg/dL Final    Comment:    .            Fasting reference interval .   11/25/2022 116 (H) 65 - 99 mg/dL Final    Comment:    .            Fasting reference interval . For someone without known diabetes, a glucose value between 100 and 125 mg/dL is consistent with prediabetes and should be confirmed with a follow-up test. .   10/02/2021 94 65 - 99 mg/dL Final    Comment:    .            Fasting reference interval .     Advanced Care Planning:  A voluntary discussion about advance care planning including the explanation and discussion of advance directives.   Discussed health care proxy and Living will, and the patient was able to identify a health care proxy as ***.   Patient {DOES_DOES OZD:66440} have a living will at present time.   Social History       Social History   Socioeconomic History   Marital status: Divorced    Spouse name: Not on file   Number of children: 2   Years of education: Not on file   Highest education level: High school graduate  Occupational History   Occupation: retired  Tobacco Use   Smoking status: Former    Types: Cigarettes   Smokeless tobacco: Never  Vaping Use   Vaping status: Never Used  Substance and Sexual Activity   Alcohol use: Yes    Alcohol/week: 0.0 standard drinks of alcohol    Comment: rarely   Drug use: No    Comment: tried marijuana in the past   Sexual activity: Not Currently  Other Topics Concern   Not on file  Social History Narrative   Lives with daughter in [place] for now; works part time at [employer]   Social Drivers of Health   Financial Resource Strain: Low Risk  (12/01/2023)   Overall Financial Resource Strain (CARDIA)    Difficulty of  Paying Living Expenses: Not hard at all  Food Insecurity: No Food Insecurity (12/01/2023)   Hunger Vital Sign    Worried About Running Out of Food in the Last Year: Never true    Ran Out of Food in the Last Year: Never true  Transportation Needs: No Transportation Needs (12/01/2023)   PRAPARE - Administrator, Civil Service (Medical): No    Lack of Transportation (Non-Medical): No  Physical Activity: Insufficiently Active (12/01/2023)   Exercise Vital Sign    Days of Exercise per Week: 4 days    Minutes of Exercise per Session: 30 min  Stress: No Stress Concern Present (12/01/2023)   [employer] of Occupational Health - Occupational Stress Questionnaire    Feeling of Stress: Not at all  Social Connections: Moderately Isolated (12/01/2023)   Social Connection and Isolation Panel    Frequency of Communication with Friends and Family: More than three times a week    Frequency of Social Gatherings with Friends and Family: Three times a week    Attends Religious Services: More than 4 times per year    Active Member of Clubs or Organizations: No    Attends Banker Meetings: Never    Marital Status: Divorced    Family History        Family History  Problem Relation Age of Onset   Cancer Father        porstate   Cancer Sister        breast   Breast cancer Sister    Cancer Brother        unknown   Hypertension Neg Hx     Patient Active Problem List   Diagnosis Date Noted   Prediabetes 05/27/2023   Anatomical narrow angle, bilateral 11/01/2021   Acute stress reaction 11/01/2021   Mixed hyperlipidemia 10/18/2019   Essential hypertension, benign 08/04/2016   Aorto-iliac atherosclerosis (HCC) 06/06/2016   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   Scoliosis of lumbar spine 06/06/2016   Other kyphosis of thoracic region 12/10/2015    Past Surgical History:  Procedure Laterality Date   ABDOMINAL HYSTERECTOMY  2000     Current  Outpatient Medications:    aspirin 81 MG tablet, Take 81 mg by mouth daily., Disp: , Rfl:    atorvastatin  (LIPITOR) 10 MG tablet, TAKE 1 TABLET BY MOUTH EVERYDAY AT BEDTIME, Disp: 90 tablet, Rfl: 1   calcium  carbonate (OSCAL) 1500 (600 Ca) MG TABS tablet, Take by mouth 2 (two) times daily with a meal., Disp: , Rfl:    cholecalciferol (VITAMIN D) 1000 units tablet, Take 1,000 Units by mouth daily., Disp: , Rfl:    Cyanocobalamin (VITAMIN B 12 PO), Take by mouth., Disp: , Rfl:    dorzolamide-timolol (COSOPT) 22.3-6.8 MG/ML ophthalmic solution, SMARTSIG:In Eye(s), Disp: , Rfl:    latanoprost (XALATAN) 0.005 % ophthalmic solution, Place 1 drop into the right eye Nightly. , Disp: , Rfl: 5   losartan -hydrochlorothiazide  (HYZAAR) 50-12.5 MG tablet, TAKE 1 TABLET BY MOUTH EVERY DAY, Disp: 90 tablet, Rfl: 1   MegaRed Omega-3 Krill Oil 500 MG CAPS, Take by mouth., Disp: , Rfl:    vitamin C (ASCORBIC ACID) 500 MG tablet, Take 500 mg by mouth daily., Disp: , Rfl:    vitamin E 1000 UNIT capsule, Take 1,000 Units by mouth daily., Disp: , Rfl:   No Known Allergies  Patient Care Team: Adeline Hone, PA-C as PCP - General (Family Medicine)   Chart Review: ***  Review of Systems        Objective:   Vitals:  Vitals:   12/01/23 1355  BP: 136/82  Pulse: 76  Resp: 16  SpO2: 98%  Weight: 203 lb (92.1 kg)  Height: 5' 2 (1.575 m)    Body mass index is 37.13 kg/m.  Physical Exam    Fall Risk:    12/01/2023    1:55 PM 05/27/2023    1:37 PM 11/25/2022    1:48 PM 11/01/2021    8:57 AM 10/02/2021   10:51 AM  Fall Risk   Falls in the past year? 0 0 0 0 0  Number falls in past yr: 0 0 0 0 0  Injury with Fall? 0 0 0 0 0  Risk for fall due to : No Fall Risks No Fall Risks  No Fall Risks No Fall Risks  Follow up Falls prevention discussed Falls prevention discussed  Falls prevention discussed  Education provided;Falls prevention discussed      Data saved with a previous flowsheet row definition     Functional Status Survey: Is the patient deaf or have difficulty hearing?: No Does the patient have difficulty seeing, even when wearing glasses/contacts?: No Does the patient have difficulty concentrating, remembering, or making decisions?: No Does the patient have difficulty walking or climbing stairs?: No Does the  patient have difficulty dressing or bathing?: No Does the patient have difficulty doing errands alone such as visiting a doctor's office or shopping?: No   Assessment & Plan:    CPE completed today  USPSTF grade A and B recommendations reviewed with patient; age-appropriate recommendations, preventive care, screening tests, etc discussed and encouraged; healthy living encouraged; see AVS for patient education given to patient  Discussed importance of 150 minutes of physical activity weekly, AHA exercise recommendations given to pt in AVS/handout  Discussed importance of healthy diet:  eating lean meats and proteins, avoiding trans fats and saturated fats, avoid simple sugars and excessive carbs in diet, eat 6 servings of fruit/vegetables daily and drink plenty of water and avoid sweet beverages.    Recommended pt to do annual eye exam and routine dental exams/cleanings  Depression, alcohol, fall screening completed as documented above and per flowsheets  Advance Care planning information and packet discussed and offered today, encouraged pt to discuss with family members/spouse/partner/friends and complete Advanced directive packet and bring copy to office   Reviewed Health Maintenance: Health Maintenance  Topic Date Due   MAMMOGRAM  06/28/2021   DEXA SCAN  06/28/2022   Colonoscopy  06/08/2023   Medicare Annual Wellness (AWV)  11/25/2023   COVID-19 Vaccine (5 - 2024-25 season) 12/13/2023 (Originally 02/16/2023)   Zoster Vaccines- Shingrix (2 of 2) 02/27/2024 (Originally 04/05/2022)   INFLUENZA VACCINE  01/16/2024   DTaP/Tdap/Td (3 - Td or Tdap) 05/05/2033    Pneumococcal Vaccine: 50+ Years  Completed   Hepatitis C Screening  Completed   HPV VACCINES  Aged Out   Meningococcal B Vaccine  Aged Out    Immunizations: Immunization History  Administered Date(s) Administered   Fluad Quad(high Dose 65+) 04/05/2022   Influenza, High Dose Seasonal PF 05/14/2021, 02/04/2023   Influenza,inj,quad, With Preservative 03/07/2020   Influenza-Unspecified 03/17/2018, 03/07/2020, 05/14/2021   PFIZER(Purple Top)SARS-COV-2 Vaccination 01/18/2020, 02/15/2020, 03/07/2020   Pfizer Covid-19 Vaccine Bivalent Booster 74yrs & up 05/14/2021   Pneumococcal Conjugate-13 01/15/2019   Pneumococcal Polysaccharide-23 04/20/2020   Tdap 08/13/2016, 05/06/2023   Zoster Recombinant(Shingrix) 02/08/2022   Zoster, Live 11/14/2015   Vaccines:  HPV: up to at age 37 , ask insurance if age between 28-45  Shingrix: 92-64 yo and ask insurance if covered when patient above 71 yo Pneumonia: *** educated and discussed with patient. Flu: *** educated and discussed with patient. COVID:      ICD-10-CM   1. Well adult exam  Z00.00     2. Aorto-iliac atherosclerosis (HCC)  I70.0    I70.8    Compliant with statin and taking baby aspirin, monitoring    3. Mixed hyperlipidemia  E78.2    Compliant with statin, due for lipid panel and CMP, no concerns or myalgias, no claudication symptoms    4. Essential hypertension, benign  I10    Pt started losartan -HCTZ, HTN improving, goal < 130/80, will continue to monitor, encouragec continued efforts to improve heart health, handout given    5. Prediabetes  R73.03     6. Colon cancer screening  Z12.11     7. Osteoporosis screening  Z13.820     8. Breast cancer screening by mammogram  Z12.31           Adeline Hone, PA-C 12/01/23 2:17 PM  [facility]

## 2023-12-01 NOTE — Progress Notes (Signed)
 Subjective:   Judith Olson is a 73 y.o. female who presents for Medicare Annual (Subsequent) preventive examination.  Visit Complete: In person  Patient Medicare AWV questionnaire was not completed by the patient  Cardiac Risk Factors include: advanced age (>84men, >58 women);dyslipidemia;obesity (BMI >30kg/m2);sedentary lifestyle;hypertension     Objective:    Today's Vitals   12/01/23 1355  BP: 136/82  Pulse: 76  Resp: 16  SpO2: 98%  Weight: 203 lb (92.1 kg)  Height: 5' 2 (1.575 m)   Body mass index is 37.13 kg/m.  Physical Exam Vitals and nursing note reviewed.  Constitutional:      General: She is not in acute distress.    Appearance: Normal appearance. She is well-developed. She is obese. She is not ill-appearing, toxic-appearing or diaphoretic.  HENT:     Head: Normocephalic and atraumatic.     Right Ear: Tympanic membrane, ear canal and external ear normal. There is no impacted cerumen.     Left Ear: Tympanic membrane, ear canal and external ear normal. There is no impacted cerumen.     Nose: Congestion present.     Mouth/Throat:     Pharynx: Oropharynx is clear. No oropharyngeal exudate or posterior oropharyngeal erythema.   Eyes:     General: No scleral icterus.       Right eye: No discharge.        Left eye: No discharge.     Conjunctiva/sclera: Conjunctivae normal.   Neck:     Trachea: No tracheal deviation.   Cardiovascular:     Rate and Rhythm: Normal rate.     Pulses: Normal pulses.     Heart sounds: Normal heart sounds. No murmur heard.    No friction rub. No gallop.  Pulmonary:     Effort: Pulmonary effort is normal. No respiratory distress.     Breath sounds: Normal breath sounds. No stridor. No wheezing, rhonchi or rales.  Abdominal:     General: There is distension.     Palpations: Abdomen is soft.  Lymphadenopathy:     Cervical: No cervical adenopathy.   Skin:    General: Skin is warm and dry.     Capillary Refill: Capillary  refill takes less than 2 seconds.     Findings: No rash.   Neurological:     Mental Status: She is alert.     Motor: No abnormal muscle tone.     Coordination: Coordination normal.     Gait: Gait normal.   Psychiatric:        Mood and Affect: Mood normal.        Behavior: Behavior normal.       Wt Readings from Last 5 Encounters:  12/01/23 203 lb (92.1 kg)  05/27/23 214 lb (97.1 kg)  11/25/22 210 lb 1.6 oz (95.3 kg)  11/01/21 225 lb (102.1 kg)  10/02/21 224 lb 14.4 oz (102 kg)   BMI Readings from Last 5 Encounters:  12/01/23 37.13 kg/m  05/27/23 39.14 kg/m  11/25/22 38.43 kg/m  11/01/21 41.15 kg/m  10/02/21 41.13 kg/m        12/01/2023    2:20 PM 11/25/2022    6:20 PM 08/15/2020    8:37 AM 01/15/2019    2:31 PM 02/10/2017    4:01 PM 11/15/2016    1:49 PM 08/13/2016    3:59 PM  Advanced Directives  Does Patient Have a Medical Advance Directive? No No No No No  No  No   Would patient like information on  creating a medical advance directive? Yes (MAU/Ambulatory/Procedural Areas - Information given) Yes (MAU/Ambulatory/Procedural Areas - Information given) Yes (MAU/Ambulatory/Procedural Areas - Information given) Yes (MAU/Ambulatory/Procedural Areas - Information given)         Data saved with a previous flowsheet row definition    Current Medications (verified) Outpatient Encounter Medications as of 12/01/2023  Medication Sig   aspirin 81 MG tablet Take 81 mg by mouth daily.   atorvastatin  (LIPITOR) 10 MG tablet Take 1 tablet (10 mg total) by mouth daily.   calcium  carbonate (OSCAL) 1500 (600 Ca) MG TABS tablet Take by mouth 2 (two) times daily with a meal.   cholecalciferol (VITAMIN D) 1000 units tablet Take 1,000 Units by mouth daily.   Cyanocobalamin (VITAMIN B 12 PO) Take by mouth.   dorzolamide-timolol (COSOPT) 22.3-6.8 MG/ML ophthalmic solution SMARTSIG:In Eye(s)   latanoprost (XALATAN) 0.005 % ophthalmic solution Place 1 drop into the right eye Nightly.     losartan -hydrochlorothiazide  (HYZAAR) 50-12.5 MG tablet TAKE 1 TABLET BY MOUTH EVERY DAY   MegaRed Omega-3 Krill Oil 500 MG CAPS Take by mouth.   vitamin C (ASCORBIC ACID) 500 MG tablet Take 500 mg by mouth daily.   vitamin E 1000 UNIT capsule Take 1,000 Units by mouth daily.   [DISCONTINUED] atorvastatin  (LIPITOR) 10 MG tablet TAKE 1 TABLET BY MOUTH EVERYDAY AT BEDTIME   [DISCONTINUED] losartan -hydrochlorothiazide  (HYZAAR) 50-12.5 MG tablet TAKE 1 TABLET BY MOUTH EVERY DAY   No facility-administered encounter medications on file as of 12/01/2023.    Allergies (verified) Patient has no known allergies.   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   Found on imaging Nov 2017   Essential hypertension, benign 08/04/2016   Hyperlipidemia    Hypertension    Muscle spasms of both lower extremities    Obesity 06/06/2016   Scoliosis of lumbar spine 06/06/2016   Past Surgical History:  Procedure Laterality Date   ABDOMINAL HYSTERECTOMY  2000   Family History  Problem Relation Age of Onset   Cancer Father        porstate   Cancer Sister        breast   Breast cancer Sister    Cancer Brother        unknown   Hypertension Neg Hx    Social History   Socioeconomic History   Marital status: Divorced    Spouse name: Not on file   Number of children: 2   Years of education: Not on file   Highest education level: High school graduate  Occupational History   Occupation: retired  Tobacco Use   Smoking status: Former    Types: Cigarettes   Smokeless tobacco: Never  Vaping Use   Vaping status: Never Used  Substance and Sexual Activity   Alcohol use: Yes    Alcohol/week: 0.0 standard drinks of alcohol    Comment: rarely   Drug use: No    Comment: tried marijuana in the past   Sexual activity: Not  Currently  Other Topics Concern   Not on file  Social History Narrative   Lives with daughter in [place] for now; works part time at [employer]   Social Drivers of Health   Financial Resource Strain: Low Risk  (12/01/2023)   Overall Financial Resource Strain (CARDIA)    Difficulty of Paying Living Expenses: Not  hard at all  Food Insecurity: No Food Insecurity (12/01/2023)   Hunger Vital Sign    Worried About Running Out of Food in the Last Year: Never true    Ran Out of Food in the Last Year: Never true  Transportation Needs: No Transportation Needs (12/01/2023)   PRAPARE - Administrator, Civil Service (Medical): No    Lack of Transportation (Non-Medical): No  Physical Activity: Insufficiently Active (12/01/2023)   Exercise Vital Sign    Days of Exercise per Week: 4 days    Minutes of Exercise per Session: 30 min  Stress: No Stress Concern Present (12/01/2023)   [employer] of Occupational Health - Occupational Stress Questionnaire    Feeling of Stress: Not at all  Social Connections: Moderately Isolated (12/01/2023)   Social Connection and Isolation Panel    Frequency of Communication with Friends and Family: More than three times a week    Frequency of Social Gatherings with Friends and Family: Three times a week    Attends Religious Services: More than 4 times per year    Active Member of Clubs or Organizations: No    Attends Banker Meetings: Never    Marital Status: Divorced    Tobacco Counseling Counseling given: Not Answered   Clinical Intake:  Pre-visit preparation completed: No  Pain : No/denies pain     BMI - recorded: 37.13 Nutritional Status: BMI > 30  Obese Nutritional Risks: None Diabetes: No  How often do you need to have someone help you when you read instructions, pamphlets, or other written materials from your doctor or pharmacy?: 3 - Sometimes What is the last grade level you completed in school?: she will ask for help  with comprehension if she doesn't understand  Interpreter Needed?: No  Comments: none Information entered by :: LT   Activities of Daily Living    12/01/2023    1:55 PM 05/27/2023    1:37 PM  In your present state of health, do you have any difficulty performing the following activities:  Hearing? 0 0  Vision? 0 0  Difficulty concentrating or making decisions? 0 0  Walking or climbing stairs? 0 0  Dressing or bathing? 0 0  Doing errands, shopping? 0 0  Preparing Food and eating ? N   Using the Toilet? N   In the past six months, have you accidently leaked urine? N   Do you have problems with loss of bowel control? N   Managing your Medications? N   Managing your Finances? N   Housekeeping or managing your Housekeeping? N     Patient Care Team: Kennon Encinas, PA-C as PCP - General (Family Medicine)  Indicate any recent Medical Services you may have received from other than [facility] providers in the past year (date may be approximate).     Assessment:   This is a routine wellness examination for Judith Olson.  Hearing/Vision screen No results found.   Goals Addressed             This Visit's Progress    Exercise 150 min/wk Moderate Activity   Not on track    She is working on going to the gym and working with a trainer there on work outs and nutrition Has not gone to the gym as much as she wanted to or hitting the 150 min       Depression Screen    12/01/2023    1:55 PM 05/27/2023    1:37 PM 11/25/2022  1:49 PM 11/01/2021    8:57 AM 10/02/2021   10:51 AM 10/31/2020    2:07 PM 08/15/2020    8:36 AM  PHQ 2/9 Scores  PHQ - 2 Score 0 0 0 0 0 0 0  PHQ- 9 Score    0 0 0     Fall Risk    12/01/2023    1:55 PM 05/27/2023    1:37 PM 11/25/2022    1:48 PM 11/01/2021    8:57 AM 10/02/2021   10:51 AM  Fall Risk   Falls in the past year? 0 0 0 0 0  Number falls in past yr: 0 0 0 0 0  Injury with Fall? 0 0 0 0 0  Risk for fall due to : No Fall Risks No Fall Risks  No  Fall Risks No Fall Risks  Follow up Falls prevention discussed Falls prevention discussed  Falls prevention discussed  Education provided;Falls prevention discussed      Data saved with a previous flowsheet row definition    MEDICARE RISK AT HOME: Medicare Risk at Home Any stairs in or around the home?: Yes If so, are there any without handrails?: No Home free of loose throw rugs in walkways, pet beds, electrical cords, etc?: Yes Adequate lighting in your home to reduce risk of falls?: Yes Life alert?: No Use of a cane, walker or w/c?: No Grab bars in the bathroom?: No Shower chair or bench in shower?: No Elevated toilet seat or a handicapped toilet?: No  TIMED UP AND GO:  Was the test performed?  Yes  Length of time to ambulate 10 feet: 6 sec Gait steady and fast without use of assistive device    Cognitive Function:    07/12/2019    4:19 PM  MMSE - Mini Mental State Exam  Orientation to time 5  Orientation to Place 4  Registration 3  Attention/ Calculation 0  Recall 2  Language- name 2 objects 2  Language- repeat 1  Language- follow 3 step command 3  Language- read & follow direction 1  Write a sentence 1  Copy design 1  Total score 23        12/01/2023    2:23 PM 01/15/2019    2:37 PM  6CIT Screen  What Year? 0 points 0 points  What month? 0 points 0 points  What time? 0 points 0 points  Count back from 20 0 points 0 points  Months in reverse 0 points 0 points  Repeat phrase 4 points 2 points  Total Score 4 points 2 points    Immunizations Immunization History  Administered Date(s) Administered   Fluad Quad(high Dose 65+) 04/05/2022   Influenza, High Dose Seasonal PF 05/14/2021, 02/04/2023   Influenza,inj,quad, With Preservative 03/07/2020   Influenza-Unspecified 03/17/2018, 03/07/2020, 05/14/2021   PFIZER(Purple Top)SARS-COV-2 Vaccination 01/18/2020, 02/15/2020, 03/07/2020   Pfizer Covid-19 Vaccine Bivalent Booster 21yrs & up 05/14/2021    Pneumococcal Conjugate-13 01/15/2019   Pneumococcal Polysaccharide-23 04/20/2020   Tdap 08/13/2016, 05/06/2023   Zoster Recombinant(Shingrix) 02/08/2022   Zoster, Live 11/14/2015    TDAP status: Up to date  Flu Vaccine status: Up to date  Pneumococcal vaccine status: Due, Education has been provided regarding the importance of this vaccine. Advised may receive this vaccine at local pharmacy or Health Dept. Aware to provide a copy of the vaccination record if obtained from local pharmacy or Health Dept. Verbalized acceptance and understanding.  Covid-19 vaccine status: Declined, Education has been provided regarding the importance  of this vaccine but patient still declined. Advised may receive this vaccine at local pharmacy or Health Dept.or vaccine clinic. Aware to provide a copy of the vaccination record if obtained from local pharmacy or Health Dept. Verbalized acceptance and understanding.  Qualifies for Shingles Vaccine? Yes - complete last one  Shingrix Completed?: No.    Education has been provided regarding the importance of this vaccine. Patient has been advised to call insurance company to determine out of pocket expense if they have not yet received this vaccine. Advised may also receive vaccine at local pharmacy or Health Dept. Verbalized acceptance and understanding.  Screening Tests Health Maintenance  Topic Date Due   MAMMOGRAM  06/28/2021   DEXA SCAN  06/28/2022   Colonoscopy  06/08/2023   COVID-19 Vaccine (5 - 2024-25 season) 12/13/2023 (Originally 02/16/2023)   Zoster Vaccines- Shingrix (2 of 2) 02/27/2024 (Originally 04/05/2022)   INFLUENZA VACCINE  01/16/2024   Medicare Annual Wellness (AWV)  11/30/2024   DTaP/Tdap/Td (3 - Td or Tdap) 05/05/2033   Pneumococcal Vaccine: 50+ Years  Completed   Hepatitis C Screening  Completed   HPV VACCINES  Aged Out   Meningococcal B Vaccine  Aged Out    Health Maintenance  Health Maintenance Due  Topic Date Due   MAMMOGRAM   06/28/2021   DEXA SCAN  06/28/2022   Colonoscopy  06/08/2023    Colorectal cancer screening: Referral to GI placed [place]. Pt aware the office will call re: appt.  Mammogram status: Ordered /due. Pt provided with contact info and advised to call to schedule appt.   Bone Density status: Ordered /due. Pt provided with contact info and advised to call to schedule appt.  Lung Cancer Screening: (Low Dose CT Chest recommended if Age 86-80 years, 20 pack-year currently smoking OR have quit w/in 15years.) does not qualify.   Lung Cancer Screening Referral: not done  Additional Screening:  Hepatitis C Screening: does not qualify; Completed   Vision Screening: Recommended annual ophthalmology exams for early detection of glaucoma and other disorders of the eye. Is the patient up to date with their annual eye exam?  No  Sees eye doctor  Dental Screening: Recommended annual dental exams for proper oral hygiene  Diabetic Foot Exam: n/a  Community Resource Referral / Chronic Care Management: CRR required this visit?  No   CCM required this visit?  No     Plan:     I have personally reviewed and noted the following in the patient's chart:   Medical and social history Use of alcohol, tobacco or illicit drugs  Current medications and supplements including opioid prescriptions. Patient is not currently taking opioid prescriptions. Functional ability and status Nutritional status Physical activity Advanced directives List of other physicians Hospitalizations, surgeries, and ER visits in previous 12 months Vitals Screenings to include cognitive, depression, and falls Referrals and appointments  In addition, I have reviewed and discussed with patient certain preventive protocols, quality metrics, and best practice recommendations. A written personalized care plan for preventive services as well as general preventive health recommendations were provided to patient.      ICD-10-CM    1. Encounter for Medicare annual wellness exam  Z00.00     2. Well adult exam  Z00.00 HgB A1c    Comprehensive Metabolic Panel (CMET)    CBC with Differential/Platelet    Lipid Profile    3. Aorto-iliac atherosclerosis (HCC)  I70.0 atorvastatin  (LIPITOR) 10 MG tablet   I70.8 CBC with Differential/Platelet   Compliant  with statin and taking baby aspirin, monitoring    4. Mixed hyperlipidemia  E78.2 atorvastatin  (LIPITOR) 10 MG tablet    Lipid Profile   Compliant with statin, due for lipid panel and CMP, no concerns or myalgias, no claudication symptoms    5. Essential hypertension, benign  I10    BP near goal with losartan -HCTZ, HTN improving, goal < 130/80, will continue to monitor    6. Prediabetes  R73.03 HgB A1c    Comprehensive Metabolic Panel (CMET)    7. Colon cancer screening  Z12.11 Ambulatory referral to Gastroenterology    8. Osteoporosis screening  Z13.820 DG Bone Density    9. Breast cancer screening by mammogram  Z12.31 MM 3D SCREENING MAMMOGRAM BILATERAL BREAST    10. Class 2 obesity with body mass index (BMI) of 37.0 to 37.9 in adult, unspecified obesity type, unspecified whether serious comorbidity present  E66.812    Z68.37    associated HLD HTN and atherosclerotic disease, she is working on diet/lifestyle and loosing weight        Adeline Hone, PA-C   12/01/2023   After Visit Summary: (In Person-Printed) AVS printed and given to the patient

## 2023-12-02 LAB — HEMOGLOBIN A1C
Hgb A1c MFr Bld: 5.7 % — ABNORMAL HIGH
Mean Plasma Glucose: 117 mg/dL
eAG (mmol/L): 6.5 mmol/L

## 2023-12-02 LAB — COMPREHENSIVE METABOLIC PANEL WITH GFR
AG Ratio: 1.3 (calc) (ref 1.0–2.5)
ALT: 11 U/L (ref 6–29)
AST: 16 U/L (ref 10–35)
Albumin: 4 g/dL (ref 3.6–5.1)
Alkaline phosphatase (APISO): 76 U/L (ref 37–153)
BUN: 17 mg/dL (ref 7–25)
CO2: 27 mmol/L (ref 20–32)
Calcium: 9.6 mg/dL (ref 8.6–10.4)
Chloride: 105 mmol/L (ref 98–110)
Creat: 0.82 mg/dL (ref 0.60–1.00)
Globulin: 3 g/dL (ref 1.9–3.7)
Glucose, Bld: 79 mg/dL (ref 65–99)
Potassium: 4.1 mmol/L (ref 3.5–5.3)
Sodium: 141 mmol/L (ref 135–146)
Total Bilirubin: 0.6 mg/dL (ref 0.2–1.2)
Total Protein: 7 g/dL (ref 6.1–8.1)
eGFR: 77 mL/min/1.73m2

## 2023-12-02 LAB — CBC WITH DIFFERENTIAL/PLATELET
Absolute Lymphocytes: 2720 {cells}/uL (ref 850–3900)
Absolute Monocytes: 490 {cells}/uL (ref 200–950)
Basophils Absolute: 20 {cells}/uL (ref 0–200)
Basophils Relative: 0.4 %
Eosinophils Absolute: 88 {cells}/uL (ref 15–500)
Eosinophils Relative: 1.8 %
HCT: 38.6 % (ref 35.0–45.0)
Hemoglobin: 11.7 g/dL (ref 11.7–15.5)
MCH: 27 pg (ref 27.0–33.0)
MCHC: 30.3 g/dL — ABNORMAL LOW (ref 32.0–36.0)
MCV: 89.1 fL (ref 80.0–100.0)
MPV: 11.7 fL (ref 7.5–12.5)
Monocytes Relative: 10 %
Neutro Abs: 1583 {cells}/uL (ref 1500–7800)
Neutrophils Relative %: 32.3 %
Platelets: 233 10*3/uL (ref 140–400)
RBC: 4.33 10*6/uL (ref 3.80–5.10)
RDW: 13.7 % (ref 11.0–15.0)
Total Lymphocyte: 55.5 %
WBC: 4.9 10*3/uL (ref 3.8–10.8)

## 2023-12-02 LAB — LIPID PANEL
Cholesterol: 151 mg/dL (ref <200)
HDL: 64 mg/dL (ref >=50)
LDL Cholesterol (Calc): 74 mg/dL
Non-HDL Cholesterol (Calc): 87 mg/dL (ref <130)
Total CHOL/HDL Ratio: 2.4 (calc) (ref <5.0)
Triglycerides: 51 mg/dL (ref <150)

## 2023-12-03 ENCOUNTER — Ambulatory Visit: Payer: Self-pay | Admitting: Family Medicine

## 2023-12-16 DIAGNOSIS — M546 Pain in thoracic spine: Secondary | ICD-10-CM | POA: Diagnosis not present

## 2023-12-16 DIAGNOSIS — I10 Essential (primary) hypertension: Secondary | ICD-10-CM | POA: Diagnosis not present

## 2023-12-16 DIAGNOSIS — R109 Unspecified abdominal pain: Secondary | ICD-10-CM | POA: Diagnosis not present

## 2024-06-01 ENCOUNTER — Ambulatory Visit: Admitting: Family Medicine
